# Patient Record
Sex: Male | Born: 1940
Health system: Southern US, Community
[De-identification: ages and names within clinical notes are randomized; demographics above are authoritative.]

## PROBLEM LIST (undated history)

## (undated) DIAGNOSIS — F32A Depression, unspecified: Secondary | ICD-10-CM

## (undated) DIAGNOSIS — F329 Major depressive disorder, single episode, unspecified: Secondary | ICD-10-CM

## (undated) DIAGNOSIS — E785 Hyperlipidemia, unspecified: Secondary | ICD-10-CM

## (undated) DIAGNOSIS — F419 Anxiety disorder, unspecified: Secondary | ICD-10-CM

## (undated) DIAGNOSIS — I10 Essential (primary) hypertension: Secondary | ICD-10-CM

## (undated) HISTORY — DX: Anxiety disorder, unspecified: F41.9

## (undated) HISTORY — PX: TONSILLECTOMY: SUR1361

## (undated) HISTORY — PX: OTHER SURGICAL HISTORY: SHX169

## (undated) HISTORY — DX: Major depressive disorder, single episode, unspecified: F32.9

## (undated) HISTORY — DX: Hyperlipidemia, unspecified: E78.5

## (undated) HISTORY — DX: Essential (primary) hypertension: I10

## (undated) HISTORY — DX: Depression, unspecified: F32.A

---

## 1999-12-22 ENCOUNTER — Encounter: Admission: RE | Admit: 1999-12-22 | Discharge: 2000-01-10 | Payer: Self-pay | Admitting: Internal Medicine

## 1999-12-22 ENCOUNTER — Encounter: Payer: Self-pay | Admitting: Internal Medicine

## 1999-12-22 ENCOUNTER — Encounter: Admission: RE | Admit: 1999-12-22 | Discharge: 1999-12-22 | Payer: Self-pay | Admitting: Internal Medicine

## 2002-07-09 ENCOUNTER — Encounter: Admission: RE | Admit: 2002-07-09 | Discharge: 2002-07-09 | Payer: Self-pay | Admitting: Internal Medicine

## 2002-07-09 ENCOUNTER — Encounter: Payer: Self-pay | Admitting: Internal Medicine

## 2002-07-17 ENCOUNTER — Encounter: Admission: RE | Admit: 2002-07-17 | Discharge: 2002-07-17 | Payer: Self-pay | Admitting: Internal Medicine

## 2002-07-17 ENCOUNTER — Encounter: Payer: Self-pay | Admitting: Internal Medicine

## 2002-07-18 ENCOUNTER — Ambulatory Visit (HOSPITAL_COMMUNITY): Admission: RE | Admit: 2002-07-18 | Discharge: 2002-07-18 | Payer: Self-pay | Admitting: Internal Medicine

## 2002-07-18 ENCOUNTER — Encounter: Payer: Self-pay | Admitting: Internal Medicine

## 2008-01-27 ENCOUNTER — Ambulatory Visit: Payer: Self-pay | Admitting: Internal Medicine

## 2008-06-07 ENCOUNTER — Ambulatory Visit: Payer: Self-pay | Admitting: Internal Medicine

## 2008-12-06 ENCOUNTER — Ambulatory Visit: Payer: Self-pay | Admitting: Internal Medicine

## 2009-01-28 ENCOUNTER — Ambulatory Visit: Payer: Self-pay | Admitting: Internal Medicine

## 2009-06-24 ENCOUNTER — Ambulatory Visit: Payer: Self-pay | Admitting: Internal Medicine

## 2009-11-28 ENCOUNTER — Ambulatory Visit: Payer: Self-pay | Admitting: Internal Medicine

## 2010-06-26 ENCOUNTER — Other Ambulatory Visit: Payer: Medicare Other | Admitting: Internal Medicine

## 2010-06-26 ENCOUNTER — Encounter (INDEPENDENT_AMBULATORY_CARE_PROVIDER_SITE_OTHER): Payer: Medicare Other | Admitting: Internal Medicine

## 2010-06-26 DIAGNOSIS — E785 Hyperlipidemia, unspecified: Secondary | ICD-10-CM

## 2010-06-26 DIAGNOSIS — M109 Gout, unspecified: Secondary | ICD-10-CM

## 2010-06-26 DIAGNOSIS — I10 Essential (primary) hypertension: Secondary | ICD-10-CM

## 2010-10-06 ENCOUNTER — Other Ambulatory Visit: Payer: Self-pay | Admitting: Internal Medicine

## 2010-12-21 ENCOUNTER — Encounter: Payer: Self-pay | Admitting: Internal Medicine

## 2010-12-26 ENCOUNTER — Other Ambulatory Visit: Payer: Medicare Other | Admitting: Internal Medicine

## 2010-12-26 DIAGNOSIS — E785 Hyperlipidemia, unspecified: Secondary | ICD-10-CM

## 2010-12-26 LAB — HEPATIC FUNCTION PANEL
AST: 18 U/L (ref 0–37)
Albumin: 4.5 g/dL (ref 3.5–5.2)
Total Bilirubin: 0.6 mg/dL (ref 0.3–1.2)

## 2010-12-26 LAB — LIPID PANEL
Cholesterol: 147 mg/dL (ref 0–200)
HDL: 54 mg/dL (ref >39)
LDL Cholesterol: 71 mg/dL (ref 0–99)
Total CHOL/HDL Ratio: 2.7 Ratio
Triglycerides: 108 mg/dL (ref <150)
VLDL: 22 mg/dL (ref 0–40)

## 2011-01-01 ENCOUNTER — Ambulatory Visit (INDEPENDENT_AMBULATORY_CARE_PROVIDER_SITE_OTHER): Payer: Medicare Other | Admitting: Internal Medicine

## 2011-01-01 ENCOUNTER — Encounter: Payer: Self-pay | Admitting: Internal Medicine

## 2011-01-01 VITALS — BP 124/79 | HR 80 | Temp 97.8°F | Ht 69.0 in | Wt 218.0 lb

## 2011-01-01 DIAGNOSIS — F32A Depression, unspecified: Secondary | ICD-10-CM

## 2011-01-01 DIAGNOSIS — E785 Hyperlipidemia, unspecified: Secondary | ICD-10-CM

## 2011-01-01 DIAGNOSIS — N529 Male erectile dysfunction, unspecified: Secondary | ICD-10-CM

## 2011-01-01 DIAGNOSIS — M109 Gout, unspecified: Secondary | ICD-10-CM

## 2011-01-01 DIAGNOSIS — I10 Essential (primary) hypertension: Secondary | ICD-10-CM

## 2011-01-01 DIAGNOSIS — F3289 Other specified depressive episodes: Secondary | ICD-10-CM

## 2011-01-01 DIAGNOSIS — Z23 Encounter for immunization: Secondary | ICD-10-CM

## 2011-01-01 DIAGNOSIS — F329 Major depressive disorder, single episode, unspecified: Secondary | ICD-10-CM

## 2011-01-04 DIAGNOSIS — F329 Major depressive disorder, single episode, unspecified: Secondary | ICD-10-CM | POA: Insufficient documentation

## 2011-01-04 DIAGNOSIS — I10 Essential (primary) hypertension: Secondary | ICD-10-CM | POA: Insufficient documentation

## 2011-01-04 DIAGNOSIS — E785 Hyperlipidemia, unspecified: Secondary | ICD-10-CM | POA: Insufficient documentation

## 2011-01-04 DIAGNOSIS — N529 Male erectile dysfunction, unspecified: Secondary | ICD-10-CM | POA: Insufficient documentation

## 2011-01-04 DIAGNOSIS — M109 Gout, unspecified: Secondary | ICD-10-CM | POA: Insufficient documentation

## 2011-01-04 NOTE — Progress Notes (Signed)
  Subjective:    Patient ID: Dennard Nip, male    DOB: 11/01/1940, 70 y.o.   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 History of hypertension, hyperlipidemia, gout, depression, erectile dysfunction. For six-month recheck. No new complaints or problems.    Review of Systems     Objective:   Physical Exam no JVD thyromegaly or carotid bruits; chest clear, cardiac exam regular rate and rhythm normal S1 and S2, extremities without edema        Assessment & Plan:  Hypertension-stable  Hyperlipidemia-stable on statin  Family history of coronary artery disease in his father and stroke in his mother  Gout stable on allopurinol  History of depression treated with Prozac successfully  Erectile dysfunction-uses Viagra when necessary  Return in 6 months for physical exam

## 2011-02-22 ENCOUNTER — Other Ambulatory Visit: Payer: Self-pay | Admitting: Internal Medicine

## 2011-07-05 ENCOUNTER — Other Ambulatory Visit: Payer: Medicare Other | Admitting: Internal Medicine

## 2011-07-05 DIAGNOSIS — I10 Essential (primary) hypertension: Secondary | ICD-10-CM

## 2011-07-05 DIAGNOSIS — E785 Hyperlipidemia, unspecified: Secondary | ICD-10-CM

## 2011-07-05 DIAGNOSIS — Z125 Encounter for screening for malignant neoplasm of prostate: Secondary | ICD-10-CM

## 2011-07-05 LAB — CBC WITH DIFFERENTIAL/PLATELET
Basophils Absolute: 0 10*3/uL (ref 0.0–0.1)
HCT: 43 % (ref 39.0–52.0)
Hemoglobin: 14.8 g/dL (ref 13.0–17.0)
Lymphocytes Relative: 28 % (ref 12–46)
Monocytes Absolute: 0.6 10*3/uL (ref 0.1–1.0)
Neutro Abs: 2.6 10*3/uL (ref 1.7–7.7)
RBC: 4.6 MIL/uL (ref 4.22–5.81)
RDW: 13.7 % (ref 11.5–15.5)
WBC: 4.5 10*3/uL (ref 4.0–10.5)

## 2011-07-05 LAB — COMPREHENSIVE METABOLIC PANEL
ALT: 20 U/L (ref 0–53)
AST: 21 U/L (ref 0–37)
Albumin: 4.6 g/dL (ref 3.5–5.2)
BUN: 13 mg/dL (ref 6–23)
Calcium: 9.5 mg/dL (ref 8.4–10.5)
Chloride: 101 mEq/L (ref 96–112)
Potassium: 4.8 mEq/L (ref 3.5–5.3)

## 2011-07-06 ENCOUNTER — Encounter: Payer: Self-pay | Admitting: Internal Medicine

## 2011-07-06 ENCOUNTER — Ambulatory Visit (INDEPENDENT_AMBULATORY_CARE_PROVIDER_SITE_OTHER): Payer: Medicare Other | Admitting: Internal Medicine

## 2011-07-06 VITALS — BP 140/78 | HR 80 | Temp 97.7°F | Ht 68.5 in | Wt 224.0 lb

## 2011-07-06 DIAGNOSIS — Z0000 Encounter for general adult medical examination without abnormal findings: Secondary | ICD-10-CM

## 2011-07-06 DIAGNOSIS — E785 Hyperlipidemia, unspecified: Secondary | ICD-10-CM

## 2011-07-06 DIAGNOSIS — I10 Essential (primary) hypertension: Secondary | ICD-10-CM

## 2011-07-06 LAB — POCT URINALYSIS DIPSTICK
Bilirubin, UA: NEGATIVE
Glucose, UA: NEGATIVE
Leukocytes, UA: NEGATIVE
Nitrite, UA: NEGATIVE

## 2011-07-06 LAB — PSA, MEDICARE: PSA: 0.3 ng/mL (ref <=4.00)

## 2011-07-10 ENCOUNTER — Other Ambulatory Visit: Payer: Self-pay | Admitting: Internal Medicine

## 2011-07-21 NOTE — Patient Instructions (Signed)
Colonoscopy is due December 2013. Please contact gastroenterology office for appointment. Continue same medications and return in 6 months.

## 2011-07-21 NOTE — Progress Notes (Signed)
  Subjective:    Patient ID: Jimmy [name], male    DOB: 1940-11-07, 71 y.o.   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 History of hypertension, hyperlipidemia, depression, rectal dysfunction and gout. He is due for repeat colonoscopy December 2013. Last colonoscopy December 2003 by Dr. Juanda Chance. Had exercise tolerance test 1998 by Dr. Charlies Constable. Zostavax vaccine August 2010. Pneumovax vaccine March 2011. Tetanus immunization 2007. Gets annual influenza immunization. History of herpes zoster right temple December 1998. History of cellulitis right foot 1996. No known drug allergies.  Family history father with history of coronary artery disease status post CABG died with heart problems. Father had history of hypertension and gout. He also had a history of alcohol abuse.  Mother living with history of stroke and hypertension. 3 sisters and 2 brothers in good health. 3 adult daughters in good health.  Social history patient is divorced. Does not smoke. Social alcohol consumption consisting of beer daily.    Review of Systems  Constitutional: Negative.   HENT: Negative.   Eyes: Negative.   Respiratory: Negative.   Cardiovascular: Negative.   Gastrointestinal: Negative.   Genitourinary:       Erectile dysfunction. Some BPH symptoms  Musculoskeletal:       History of gout  Neurological: Negative.   Psychiatric/Behavioral:       History of depression       Objective:   Physical Exam  Vitals reviewed. Constitutional: He is oriented to person, place, and time. He appears well-developed and well-nourished. No distress.  HENT:  Head: Normocephalic and atraumatic.  Right Ear: External ear normal.  Left Ear: External ear normal.  Mouth/Throat: Oropharynx is clear and moist.  Eyes: Conjunctivae and EOM are normal. Pupils are equal, round, and reactive to light. Right eye  exhibits no discharge. Left eye exhibits no discharge. No scleral icterus.  Neck: Neck supple. No JVD present. No thyromegaly present.       No carotid bruits  Cardiovascular: Normal rate, regular rhythm, normal heart sounds and intact distal pulses.   No murmur heard. Pulmonary/Chest: Effort normal and breath sounds normal. No respiratory distress. He has no wheezes. He has no rales.  Abdominal: Soft. Bowel sounds are normal. He exhibits no distension and no mass. There is no tenderness. There is no guarding.  Genitourinary: Prostate normal.       Prostate slightly enlarged without nodules  Musculoskeletal: Normal range of motion. He exhibits no edema.  Lymphadenopathy:    He has no cervical adenopathy.  Neurological: He is alert and oriented to person, place, and time. He has normal reflexes. No cranial nerve deficit. Coordination normal.  Skin: Skin is warm and dry. No rash noted. He is not diaphoretic.  Psychiatric: He has a normal mood and affect. His behavior is normal. Judgment normal.          Assessment & Plan:  Hypertension  Hyperlipidemia  Erectile dysfunction  History of gout  History of depression  Plan: Return in 6 months for office visit lipid panel liver functions and blood pressure check. Continue same medications. Will need repeat screening colonoscopy December 2013.

## 2011-07-25 ENCOUNTER — Telehealth: Payer: Self-pay | Admitting: Internal Medicine

## 2011-07-25 MED ORDER — FLUOXETINE HCL 10 MG PO CAPS: 10.0000 mg | ORAL_CAPSULE | Freq: Every day | ORAL | Status: DC

## 2011-07-25 NOTE — Telephone Encounter (Signed)
New rx for Prozac 10 mg po daily sent too patients pharmacy

## 2012-01-07 ENCOUNTER — Other Ambulatory Visit: Payer: Medicare Other | Admitting: Internal Medicine

## 2012-01-07 DIAGNOSIS — Z79899 Other long term (current) drug therapy: Secondary | ICD-10-CM

## 2012-01-07 DIAGNOSIS — E785 Hyperlipidemia, unspecified: Secondary | ICD-10-CM

## 2012-01-07 LAB — HEPATIC FUNCTION PANEL
Alkaline Phosphatase: 64 U/L (ref 39–117)
Bilirubin, Direct: 0.1 mg/dL (ref 0.0–0.3)
Indirect Bilirubin: 0.6 mg/dL (ref 0.0–0.9)
Total Bilirubin: 0.7 mg/dL (ref 0.3–1.2)
Total Protein: 6.9 g/dL (ref 6.0–8.3)

## 2012-01-07 LAB — LIPID PANEL
LDL Cholesterol: 94 mg/dL (ref 0–99)
Triglycerides: 212 mg/dL — ABNORMAL HIGH (ref <150)

## 2012-01-08 ENCOUNTER — Ambulatory Visit (INDEPENDENT_AMBULATORY_CARE_PROVIDER_SITE_OTHER): Payer: Medicare Other | Admitting: Internal Medicine

## 2012-01-08 VITALS — BP 140/86 | HR 80 | Temp 98.4°F | Wt 228.0 lb

## 2012-01-08 DIAGNOSIS — E785 Hyperlipidemia, unspecified: Secondary | ICD-10-CM

## 2012-01-08 DIAGNOSIS — N529 Male erectile dysfunction, unspecified: Secondary | ICD-10-CM

## 2012-01-08 DIAGNOSIS — Z8739 Personal history of other diseases of the musculoskeletal system and connective tissue: Secondary | ICD-10-CM

## 2012-01-08 DIAGNOSIS — F329 Major depressive disorder, single episode, unspecified: Secondary | ICD-10-CM

## 2012-01-08 DIAGNOSIS — F3289 Other specified depressive episodes: Secondary | ICD-10-CM

## 2012-01-08 DIAGNOSIS — Z862 Personal history of diseases of the blood and blood-forming organs and certain disorders involving the immune mechanism: Secondary | ICD-10-CM

## 2012-01-08 DIAGNOSIS — I10 Essential (primary) hypertension: Secondary | ICD-10-CM

## 2012-01-08 DIAGNOSIS — Z8639 Personal history of other endocrine, nutritional and metabolic disease: Secondary | ICD-10-CM

## 2012-01-21 ENCOUNTER — Other Ambulatory Visit: Payer: Self-pay

## 2012-01-21 MED ORDER — INDOMETHACIN 25 MG PO CAPS: 25.0000 mg | ORAL_CAPSULE | Freq: Three times a day (TID) | ORAL | Status: DC

## 2012-03-20 ENCOUNTER — Other Ambulatory Visit: Payer: Self-pay | Admitting: Internal Medicine

## 2012-03-30 ENCOUNTER — Encounter: Payer: Self-pay | Admitting: Internal Medicine

## 2012-03-30 NOTE — Patient Instructions (Addendum)
Continue same medications and return in 6 months 

## 2012-03-30 NOTE — Progress Notes (Signed)
  Subjective:    Patient ID: Jimmy [name], male    DOB: 10-11-1940, 71 y.o.   MRN: [id number]  HPI 71 year old white male in today for six-month recheck. History of hypertension, hyperlipidemia, depression, erectile dysfunction and gout. Depression well controlled on Prozac. Takes Viagra for erectile dysfunction. Is on allopurinol to control gout. Is on Zocor for hyperlipidemia and generic Mavik for hypertension. His mother died suddenly a few months ago and [place] She had a history of hypertension and CVA. He is dealing with the estate and seemingly handling it all very well. Father with history of coronary artery disease. Patient does not smoke. Social alcohol consumption.      Review of Systems     Objective:   Physical Exam neck supple without thyromegaly JVD or carotid bruits. Facial plethora, chest clear to auscultation, Cardiac exam: Regular rate and rhythm normal S1 and S2 . Extremities without edema.          Assessment & Plan:  Hypertension-reasonably well controlled  History of gout-treated with allopurinol  History of depression as well controlled on Prozac  History of erectile dysfunction treated with Viagra  Plan: Return in 6 months for physical exam.

## 2012-04-18 ENCOUNTER — Encounter: Payer: Self-pay | Admitting: Internal Medicine

## 2012-04-24 ENCOUNTER — Encounter: Payer: Self-pay | Admitting: Internal Medicine

## 2012-05-13 ENCOUNTER — Ambulatory Visit (AMBULATORY_SURGERY_CENTER): Payer: Medicare Other | Admitting: *Deleted

## 2012-05-13 VITALS — Ht 70.0 in | Wt 228.6 lb

## 2012-05-13 DIAGNOSIS — Z1211 Encounter for screening for malignant neoplasm of colon: Secondary | ICD-10-CM

## 2012-05-13 MED ORDER — MOVIPREP 100 G PO SOLR: ORAL | Status: DC

## 2012-05-27 ENCOUNTER — Encounter: Payer: Self-pay | Admitting: Internal Medicine

## 2012-05-27 ENCOUNTER — Ambulatory Visit (AMBULATORY_SURGERY_CENTER): Payer: Medicare Other | Admitting: Internal Medicine

## 2012-05-27 VITALS — BP 134/59 | HR 61 | Resp 15 | Ht 70.0 in | Wt 228.0 lb

## 2012-05-27 DIAGNOSIS — Z1211 Encounter for screening for malignant neoplasm of colon: Secondary | ICD-10-CM

## 2012-05-27 MED ORDER — SODIUM CHLORIDE 0.9 % IV SOLN: 500.0000 mL | INTRAVENOUS | Status: DC

## 2012-05-27 NOTE — Progress Notes (Signed)
Patient did not have preoperative order for IV antibiotic SSI prophylaxis. (G8918)   

## 2012-05-27 NOTE — Progress Notes (Signed)
Patient did not experience any of the following events: a burn prior to discharge; a fall within the facility; wrong site/side/patient/procedure/implant event; or a hospital transfer or hospital admission upon discharge from the facility. (G8907) Patient did not have preoperative order for IV antibiotic SSI prophylaxis. (G8918)  

## 2012-05-27 NOTE — Op Note (Signed)
[facility] [address] [place], [address]   COLONOSCOPY PROCEDURE REPORT  PATIENT: [name], Jimmy  MR#: [id number] BIRTHDATE: Dec 15, 1940 , 71  yrs. old GENDER: Male ENDOSCOPIST: Hart Carwin, MD REFERRED BY:  Sharlet Salina, M.D. PROCEDURE DATE:  05/27/2012 PROCEDURE:   Colonoscopy, screening ASA CLASS:   Class II INDICATIONS:Average risk patient for colon cancer.  colonoscopy 10 years ago was normal MEDICATIONS: MAC sedation, administered by CRNA and propofol (Diprivan) 400mg  IV  DESCRIPTION OF PROCEDURE:   After the risks and benefits and of the procedure were explained, informed consent was obtained.  A digital rectal exam revealed no abnormalities of the rectum.    The LB [id number]  endoscope was introduced through the anus and advanced to the cecum, which was identified by both the appendix and ileocecal valve .  The quality of the prep was excellent, using MoviPrep .  The instrument was then slowly withdrawn as the colon was fully examined.     COLON FINDINGS: A normal appearing cecum, ileocecal valve, and appendiceal orifice were identified.  The ascending, hepatic flexure, transverse, splenic flexure, descending, sigmoid colon and rectum appeared unremarkable.  No polyps or cancers were seen. Retroflexed views revealed no abnormalities.     The scope was then withdrawn from the patient and the procedure completed.  COMPLICATIONS: There were no complications. ENDOSCOPIC IMPRESSION: Normal colon  RECOMMENDATIONS: High fiber diet   REPEAT EXAM: In 10 year(s)  for Colonoscopy.  cc:  _______________________________ eSignedHart Carwin, MD 05/27/2012 11:44 AM

## 2012-05-27 NOTE — Patient Instructions (Addendum)
YOU HAD AN ENDOSCOPIC PROCEDURE TODAY AT THE [facility]: Refer to the procedure report that was given to you for any specific questions about what was found during the examination.  If the procedure report does not answer your questions, please call your gastroenterologist to clarify.  If you requested that your care partner not be given the details of your procedure findings, then the procedure report has been included in a sealed envelope for you to review at your convenience later.  YOU SHOULD EXPECT: Some feelings of bloating in the abdomen. Passage of more gas than usual.  Walking can help get rid of the air that was put into your GI tract during the procedure and reduce the bloating. If you had a lower endoscopy (such as a colonoscopy or flexible sigmoidoscopy) you may notice spotting of blood in your stool or on the toilet paper. If you underwent a bowel prep for your procedure, then you may not have a normal bowel movement for a few days.  DIET: Your first meal following the procedure should be a light meal and then it is ok to progress to your normal diet.  A half-sandwich or bowl of soup is an example of a good first meal.  Heavy or fried foods are harder to digest and may make you feel nauseous or bloated.  Likewise meals heavy in dairy and vegetables can cause extra gas to form and this can also increase the bloating.  Drink plenty of fluids but you should avoid alcoholic beverages for 24 hours.  ACTIVITY: Your care partner should take you home directly after the procedure.  You should plan to take it easy, moving slowly for the rest of the day.  You can resume normal activity the day after the procedure however you should NOT DRIVE or use heavy machinery for 24 hours (because of the sedation medicines used during the test).    SYMPTOMS TO REPORT IMMEDIATELY: A gastroenterologist can be reached at any hour.  During normal business hours, 8:30 AM to 5:00 PM Monday through Friday,  call [phone].  After hours and on weekends, please call the GI answering service at [phone] who will take a message and have the physician on call contact you.   Following lower endoscopy (colonoscopy or flexible sigmoidoscopy):  Excessive amounts of blood in the stool  Significant tenderness or worsening of abdominal pains  Swelling of the abdomen that is new, acute  Fever of 100F or higher  FOLLOW UP: If any biopsies were taken you will be contacted by phone or by letter within the next 1-3 weeks.  Call your gastroenterologist if you have not heard about the biopsies in 3 weeks.  Our staff will call the home number listed on your records the next business day following your procedure to check on you and address any questions or concerns that you may have at that time regarding the information given to you following your procedure. This is a courtesy call and so if there is no answer at the home number and we have not heard from you through the emergency physician on call, we will assume that you have returned to your regular daily activities without incident.  SIGNATURES/CONFIDENTIALITY: You and/or your care partner have signed paperwork which will be entered into your electronic medical record.  These signatures attest to the fact that that the information above on your After Visit Summary has been reviewed and is understood.  Full responsibility of the confidentiality of this   discharge information lies with you and/or your care-partner.   Thank-you for choosing us for your medical needs. 

## 2012-05-28 ENCOUNTER — Telehealth: Payer: Self-pay | Admitting: *Deleted

## 2012-05-28 NOTE — Telephone Encounter (Signed)
  Follow up Call-  Call back number 05/27/2012  Post procedure Call Back phone  # [phone]  Permission to leave phone message Yes     Patient questions:  Do you have a fever, pain , or abdominal swelling? no Pain Score  0 *  Have you tolerated food without any problems? yes  Have you been able to return to your normal activities? yes  Do you have any questions about your discharge instructions: Diet   no Medications  no Follow up visit  no  Do you have questions or concerns about your Care? no  Actions: * If pain score is 4 or above: No action needed, pain <4.

## 2012-07-04 ENCOUNTER — Other Ambulatory Visit: Payer: Self-pay | Admitting: Internal Medicine

## 2012-07-07 ENCOUNTER — Other Ambulatory Visit: Payer: Medicare Other | Admitting: Internal Medicine

## 2012-07-07 LAB — COMPREHENSIVE METABOLIC PANEL
ALT: 24 U/L (ref 0–53)
AST: 23 U/L (ref 0–37)
Alkaline Phosphatase: 65 U/L (ref 39–117)
Calcium: 9.4 mg/dL (ref 8.4–10.5)
Chloride: 102 mEq/L (ref 96–112)
Creat: 1.04 mg/dL (ref 0.50–1.35)
Total Bilirubin: 0.7 mg/dL (ref 0.3–1.2)

## 2012-07-07 LAB — LIPID PANEL
HDL: 60 mg/dL (ref >39)
LDL Cholesterol: 77 mg/dL (ref 0–99)
Total CHOL/HDL Ratio: 2.7 Ratio
VLDL: 22 mg/dL (ref 0–40)

## 2012-07-07 LAB — CBC WITH DIFFERENTIAL/PLATELET
Basophils Absolute: 0 10*3/uL (ref 0.0–0.1)
Basophils Relative: 1 % (ref 0–1)
Eosinophils Relative: 2 % (ref 0–5)
Lymphocytes Relative: 29 % (ref 12–46)
MCHC: 34.6 g/dL (ref 30.0–36.0)
MCV: 91.2 fL (ref 78.0–100.0)
Platelets: 174 10*3/uL (ref 150–400)
RDW: 14.2 % (ref 11.5–15.5)
WBC: 4.4 10*3/uL (ref 4.0–10.5)

## 2012-07-07 LAB — PSA, MEDICARE: PSA: 0.36 ng/mL (ref <=4.00)

## 2012-07-08 ENCOUNTER — Ambulatory Visit (INDEPENDENT_AMBULATORY_CARE_PROVIDER_SITE_OTHER): Payer: Medicare Other | Admitting: Internal Medicine

## 2012-07-08 ENCOUNTER — Encounter: Payer: Self-pay | Admitting: Internal Medicine

## 2012-07-08 VITALS — BP 126/78 | HR 76 | Ht 68.5 in | Wt 229.0 lb

## 2012-07-08 NOTE — Progress Notes (Signed)
Subjective:    Patient ID: Jimmy [name], male    DOB: 08-14-1940, 72 y.o.   MRN: [id number]  HPI  72 year old White male for health maintenance and evaluation of medical problems. He is Psychologist, counselling. Has a history of hypertension, hyperlipidemia, depression, erectile dysfunction and gout. Had colonoscopy 2003 by Dr. Juanda Chance. Repeat study due. Had exercise tolerance test in 1998 by Dr. Charlies Constable. Zostavax vaccine August 2010. Pneumovax vaccine March 2011. Tetanus immunization 2007. Gets annual influenza immunization. History of herpes zoster right temple December 1998. History of cellulitis right foot 1996.  No known drug allergies  Family history: Father with history of coronary artery disease status post CABG died with heart problems. Father had history of hypertension and gout and history of alcohol abuse. Mother had history of stroke and hypertension and died suddenly of an acute arrest perhaps stroke or MI. 32sisters and 2 brothers in good health. One sister died in a motor vehicle accident with history of alcoholism. 3 adult daughters in good health.  Social history: Patient is divorced. Does not smoke. Social alcohol consumption consisting of beer daily. Steady girlfriend is Jimmy [name].    Review of Systems  Constitutional: Negative.   All other systems reviewed and are negative.       Objective:   Physical Exam  Vitals reviewed. Constitutional: He is oriented to person, place, and time. He appears well-developed and well-nourished. No distress.  Facial plethora  HENT:  Head: Normocephalic and atraumatic.  Right Ear: External ear normal.  Left Ear: External ear normal.  Mouth/Throat: Oropharynx is clear and moist. No oropharyngeal exudate.  Eyes: Conjunctivae and EOM are normal. Pupils are equal, round, and reactive to light. Right eye exhibits no discharge. Left eye exhibits no discharge. No scleral icterus.  Neck: Neck supple. No JVD  present. No thyromegaly present.  Cardiovascular: Normal rate, regular rhythm, normal heart sounds and intact distal pulses.   No murmur heard. Pulmonary/Chest: Effort normal and breath sounds normal. No respiratory distress. He has no wheezes. He has no rales. He exhibits no tenderness.  Abdominal: Soft. Bowel sounds are normal. He exhibits no distension and no mass. There is no tenderness. There is no rebound and no guarding.  Genitourinary: Prostate normal.  Musculoskeletal: He exhibits no edema.  Lymphadenopathy:    He has no cervical adenopathy.  Neurological: He is oriented to person, place, and time. He has normal reflexes. No cranial nerve deficit. Coordination normal.  Skin: Skin is warm and dry. No rash noted. He is not diaphoretic.  Psychiatric: He has a normal mood and affect. His behavior is normal. Judgment and thought content normal.          Assessment & Plan:  Hypertension-stable on Mavik but insurance plan will no longer cover generic Mavik. He will consider paying out of pocket because he really doesn't want to switch. If he needs to switch we can consider losartan.  Hyperlipidemia-stable on statin  Depression stable on Prozac  Erectile dysfunction treated with Viagra  Gout treated with allopurinol     Subjective:   Patient presents for Medicare Annual/Subsequent preventive examination.   Review Past Medical/Family/Social: SEE EPIC   Risk Factors  Current exercise habits: walking Dietary issues discussed: low fat diet  Cardiac risk factors: Family History of heart disease, HTN, Hyperlipidemia  Depression Screen  (Note: if answer to either of the following is "Yes", a more complete depression screening is indicated)   Over the past two weeks,  have you felt down, depressed or hopeless? No  Over the past two weeks, have you felt little interest or pleasure in doing things? No Have you lost interest or pleasure in daily life? No Do you often feel  hopeless? No Do you cry easily over simple problems? No   Activities of Daily Living  In your present state of health, do you have any difficulty performing the following activities?:   Driving? No  Managing money? No  Feeding yourself? No  Getting from bed to chair? No  Climbing a flight of stairs? No  Preparing food and eating?: No  Bathing or showering? No  Getting dressed: No  Getting to the toilet? No  Using the toilet:No  Moving around from place to place: No  In the past year have you fallen or had a near fall?:No  Are you sexually active? yes Do you have more than one partner? No   Hearing Difficulties: No  Do you often ask people to speak up or repeat themselves? No  Do you experience ringing or noises in your ears? No  Do you have difficulty understanding soft or whispered voices? No  Do you feel that you have a problem with memory? No Do you often misplace items? No    Home Safety:  Do you have a smoke alarm at your residence? Yes Do you have grab bars in the bathroom? no Do you have throw rugs in your house?no   Cognitive Testing  Alert? Yes Normal Appearance?Yes  Oriented to person? Yes Place? Yes  Time? Yes  Recall of three objects? Yes  Can perform simple calculations? Yes  Displays appropriate judgment?Yes  Can read the correct time from a watch face?Yes   List the Names of Other Physician/Practitioners you currently use:  See referral list for the physicians patient is currently seeing. Dr. Emily Filbert eye doctor    Review of Systems: See Epic   Objective:     General appearance: Appears stated age   Head: Normocephalic, without obvious abnormality, atraumatic  Eyes: conj clear, EOMi PEERLA  Ears: normal TM's and external ear canals both ears  Nose: Nares normal. Septum midline. Mucosa normal. No drainage or sinus tenderness.  Throat: lips, mucosa, and tongue normal; teeth and gums normal  Neck: no adenopathy, no carotid bruit, no JVD, supple,  symmetrical, trachea midline and thyroid not enlarged, symmetric, no tenderness/mass/nodules  No CVA tenderness.  Lungs: clear to auscultation bilaterally  Breasts: normal appearance, no masses or tenderness. Heart: regular rate and rhythm, S1, S2 normal, no murmur, click, rub or gallop  Abdomen: soft, non-tender; bowel sounds normal; no masses, no organomegaly  Musculoskeletal: ROM normal in all joints, no crepitus, no deformity, Normal muscle strengthen. Back  is symmetric, no curvature. Skin: Skin color, texture, turgor normal. No rashes or lesions  Lymph nodes: Cervical, supraclavicular, and axillary nodes normal.  Neurologic: CN 2 -12 Normal, Normal symmetric reflexes. Normal coordination and gait  Psych: Alert & Oriented x 3, Mood appear stable.    Assessment:    Annual wellness medicare exam   Plan:    During the course of the visit the patient was educated and counseled about appropriate screening and preventive services including:   See above needs repeat colonoscopy     Patient Instructions (the written plan) was given to the patient.  Medicare Attestation  I have personally reviewed:  The patient's medical and social history  Their use of alcohol, tobacco or illicit drugs  Their current medications and  supplements  The patient's functional ability including ADLs,fall risks, home safety risks, cognitive, and hearing and visual impairment  Diet and physical activities  Evidence for depression or mood disorders  The patient's weight, height, BMI, and visual acuity have been recorded in the chart. I have made referrals, counseling, and provided education to the patient based on review of the above and I have provided the patient with a written personalized [name] plan for preventive services.

## 2012-07-14 ENCOUNTER — Encounter: Payer: Self-pay | Admitting: Internal Medicine

## 2012-07-14 NOTE — Patient Instructions (Addendum)
Patient to check with pharmacy to see what cost of generic Mavik will be out of pocket. If too expensive we will consider changing to losartan which apparently is covered. Otherwise continue same medications and return in 6 months. Needs to have repeat screening colonoscopy. He will contact Dr. Regino Schultze office.

## 2012-09-10 ENCOUNTER — Other Ambulatory Visit: Payer: Self-pay

## 2012-09-10 MED ORDER — FLUOXETINE HCL 10 MG PO CAPS: 10.0000 mg | ORAL_CAPSULE | Freq: Every day | ORAL | Status: DC

## 2012-10-20 ENCOUNTER — Other Ambulatory Visit: Payer: Self-pay | Admitting: Internal Medicine

## 2013-01-12 ENCOUNTER — Other Ambulatory Visit: Payer: Medicare Other | Admitting: Internal Medicine

## 2013-01-12 DIAGNOSIS — E785 Hyperlipidemia, unspecified: Secondary | ICD-10-CM

## 2013-01-12 DIAGNOSIS — Z79899 Other long term (current) drug therapy: Secondary | ICD-10-CM

## 2013-01-12 LAB — LIPID PANEL: LDL Cholesterol: 72 mg/dL (ref 0–99)

## 2013-01-12 LAB — HEPATIC FUNCTION PANEL
Alkaline Phosphatase: 63 U/L (ref 39–117)
Indirect Bilirubin: 0.5 mg/dL (ref 0.0–0.9)
Total Bilirubin: 0.6 mg/dL (ref 0.3–1.2)

## 2013-01-13 ENCOUNTER — Encounter: Payer: Self-pay | Admitting: Internal Medicine

## 2013-01-13 ENCOUNTER — Ambulatory Visit (INDEPENDENT_AMBULATORY_CARE_PROVIDER_SITE_OTHER): Payer: Medicare Other | Admitting: Internal Medicine

## 2013-01-13 VITALS — BP 120/80 | HR 76 | Temp 97.8°F | Wt 229.0 lb

## 2013-01-13 DIAGNOSIS — Z862 Personal history of diseases of the blood and blood-forming organs and certain disorders involving the immune mechanism: Secondary | ICD-10-CM

## 2013-01-13 DIAGNOSIS — I10 Essential (primary) hypertension: Secondary | ICD-10-CM

## 2013-01-13 DIAGNOSIS — E785 Hyperlipidemia, unspecified: Secondary | ICD-10-CM

## 2013-01-13 DIAGNOSIS — Z8739 Personal history of other diseases of the musculoskeletal system and connective tissue: Secondary | ICD-10-CM

## 2013-01-13 DIAGNOSIS — Z23 Encounter for immunization: Secondary | ICD-10-CM

## 2013-01-13 DIAGNOSIS — N529 Male erectile dysfunction, unspecified: Secondary | ICD-10-CM

## 2013-01-13 NOTE — Patient Instructions (Addendum)
Continue same medications and return in 6 months.  Flu vaccine given today. 

## 2013-01-13 NOTE — Progress Notes (Signed)
  Subjective:    Patient ID: Jimmy [name], male    DOB: 06-09-1940, 72 y.o.   MRN: [id number]  HPI  72 year old white male in today for followup of hypertension, hyperlipidemia, erectile dysfunction, history of gout. Since being on allopurinol has had no further recurrence of gout. Lipid panel liver functions are within normal limits on statin medication. Blood pressures under good control with current regimen. Influenza vaccine given today. No complaints. No history of chest pain.   Review of Systems     Objective:   Physical Exam Neck is supple without JVD, or carotid bruits. Chest clear to auscultation. Cardiac exam regular rate and rhythm. Skin is warm and dry. Extremities without edema.        Assessment & Plan:  Hypertension-well controlled on current regimen  Hyperlipidemia-well-controlled on statin medication  Erectile dysfunction-given coupons for Cialis and Viagra  History of gout-treated with allopurinol  Plan: Return in 6 months for physical examination.

## 2013-03-27 ENCOUNTER — Other Ambulatory Visit: Payer: Self-pay | Admitting: Internal Medicine

## 2013-07-04 ENCOUNTER — Other Ambulatory Visit: Payer: Self-pay | Admitting: Internal Medicine

## 2013-07-07 ENCOUNTER — Other Ambulatory Visit: Payer: Medicare Other | Admitting: Internal Medicine

## 2013-07-07 DIAGNOSIS — Z79899 Other long term (current) drug therapy: Secondary | ICD-10-CM

## 2013-07-07 DIAGNOSIS — Z130 Encounter for screening for diseases of the blood and blood-forming organs and certain disorders involving the immune mechanism: Secondary | ICD-10-CM

## 2013-07-07 DIAGNOSIS — Z125 Encounter for screening for malignant neoplasm of prostate: Secondary | ICD-10-CM

## 2013-07-07 DIAGNOSIS — E785 Hyperlipidemia, unspecified: Secondary | ICD-10-CM

## 2013-07-07 LAB — COMPREHENSIVE METABOLIC PANEL
ALT: 20 U/L (ref 0–53)
AST: 19 U/L (ref 0–37)
Albumin: 4.5 g/dL (ref 3.5–5.2)
Alkaline Phosphatase: 66 U/L (ref 39–117)
BUN: 13 mg/dL (ref 6–23)
CALCIUM: 9.2 mg/dL (ref 8.4–10.5)
CHLORIDE: 101 meq/L (ref 96–112)
CO2: 28 meq/L (ref 19–32)
CREATININE: 0.99 mg/dL (ref 0.50–1.35)
Glucose, Bld: 99 mg/dL (ref 70–99)
Potassium: 4.9 mEq/L (ref 3.5–5.3)
SODIUM: 138 meq/L (ref 135–145)
TOTAL PROTEIN: 6.8 g/dL (ref 6.0–8.3)
Total Bilirubin: 0.7 mg/dL (ref 0.2–1.2)

## 2013-07-07 LAB — LIPID PANEL
Cholesterol: 133 mg/dL (ref 0–200)
HDL: 53 mg/dL (ref >39)
LDL Cholesterol: 63 mg/dL (ref 0–99)
Total CHOL/HDL Ratio: 2.5 Ratio
Triglycerides: 84 mg/dL (ref <150)
VLDL: 17 mg/dL (ref 0–40)

## 2013-07-07 LAB — CBC WITH DIFFERENTIAL/PLATELET
BASOS ABS: 0 10*3/uL (ref 0.0–0.1)
Basophils Relative: 0 % (ref 0–1)
EOS PCT: 1 % (ref 0–5)
Eosinophils Absolute: 0.1 10*3/uL (ref 0.0–0.7)
HCT: 43.7 % (ref 39.0–52.0)
Hemoglobin: 15.2 g/dL (ref 13.0–17.0)
LYMPHS ABS: 1.4 10*3/uL (ref 0.7–4.0)
LYMPHS PCT: 28 % (ref 12–46)
MCH: 32.5 pg (ref 26.0–34.0)
MCHC: 34.8 g/dL (ref 30.0–36.0)
MCV: 93.6 fL (ref 78.0–100.0)
Monocytes Absolute: 0.6 10*3/uL (ref 0.1–1.0)
Monocytes Relative: 12 % (ref 3–12)
NEUTROS ABS: 3 10*3/uL (ref 1.7–7.7)
NEUTROS PCT: 59 % (ref 43–77)
PLATELETS: 190 10*3/uL (ref 150–400)
RBC: 4.67 MIL/uL (ref 4.22–5.81)
RDW: 14.7 % (ref 11.5–15.5)
WBC: 5.1 10*3/uL (ref 4.0–10.5)

## 2013-07-08 LAB — PSA, MEDICARE: PSA: 0.57 ng/mL (ref <=4.00)

## 2013-07-09 ENCOUNTER — Encounter: Payer: Self-pay | Admitting: Internal Medicine

## 2013-07-09 ENCOUNTER — Ambulatory Visit (INDEPENDENT_AMBULATORY_CARE_PROVIDER_SITE_OTHER): Payer: Medicare Other | Admitting: Internal Medicine

## 2013-07-09 VITALS — BP 146/86 | HR 80 | Temp 98.1°F | Ht 68.28 in | Wt 232.0 lb

## 2013-07-09 DIAGNOSIS — E785 Hyperlipidemia, unspecified: Secondary | ICD-10-CM

## 2013-07-09 DIAGNOSIS — Z8639 Personal history of other endocrine, nutritional and metabolic disease: Secondary | ICD-10-CM

## 2013-07-09 DIAGNOSIS — Z862 Personal history of diseases of the blood and blood-forming organs and certain disorders involving the immune mechanism: Secondary | ICD-10-CM

## 2013-07-09 DIAGNOSIS — Z8659 Personal history of other mental and behavioral disorders: Secondary | ICD-10-CM

## 2013-07-09 DIAGNOSIS — I10 Essential (primary) hypertension: Secondary | ICD-10-CM

## 2013-07-09 DIAGNOSIS — Z8739 Personal history of other diseases of the musculoskeletal system and connective tissue: Secondary | ICD-10-CM

## 2013-07-09 DIAGNOSIS — Z0000 Encounter for general adult medical examination without abnormal findings: Secondary | ICD-10-CM

## 2013-07-09 LAB — POCT URINALYSIS DIPSTICK
BILIRUBIN UA: NEGATIVE
Glucose, UA: NEGATIVE
KETONES UA: NEGATIVE
Leukocytes, UA: NEGATIVE
NITRITE UA: NEGATIVE
PH UA: 6
Protein, UA: NEGATIVE
RBC UA: NEGATIVE
Spec Grav, UA: >=1.03
Urobilinogen, UA: NEGATIVE

## 2013-08-30 ENCOUNTER — Other Ambulatory Visit: Payer: Self-pay | Admitting: Internal Medicine

## 2013-10-14 ENCOUNTER — Encounter: Payer: Self-pay | Admitting: Internal Medicine

## 2013-10-14 NOTE — Progress Notes (Signed)
Subjective:    Patient ID: Jimmy [name], male    DOB: Jimmy [name], 73 y.o.   MRN: [id number]  HPI 73 year old white male for health maintenance exam and evaluation of medical issues. He has a history of hypertension, hyperlipidemia, depression, erectile dysfunction and gout. Had colonoscopy in 2003 by Dr. Olevia Perches. Repeat study is to you. He was reminded today. Had exercise tolerance test in 1998 by Dr. Eustace Quail which was negative.  Zostavax vaccine Jimmy 2010, Pneumovax vaccine March 2011, tetanus immunization 2007, gets annual influenza immunization.  History of herpes zoster right temple December 1998. History of cellulitis right foot 1996.  No known drug allergies.  Social history: Patient is divorced. He does not smoke. Social alcohol consumption consisting of beer. Has a steady girlfriend. Has adult children, 3 daughters and a stepson. He is Recruitment consultant.  Family history: Father with history of coronary artery disease status post CABG died with heart problems. Father had history of hypertension, gout, and alcohol abuse. Mother had history of stroke and hypertension and died suddenly of an acute arrest perhaps stroke or MI. One sister died in a motor vehicle accident with history of alcoholism. 2 sisters in good health. 2 brothers, one  with history of lymphoma doing well.    Review of Systems  Constitutional: Negative.   All other systems reviewed and are negative.      Objective:   Physical Exam  Vitals reviewed. Constitutional: He is oriented to person, place, and time. He appears well-developed and well-nourished. No distress.  HENT:  Head: Normocephalic and atraumatic.  Right Ear: External ear normal.  Left Ear: External ear normal.  Mouth/Throat: Oropharynx is clear and moist. No oropharyngeal exudate.  Eyes: Conjunctivae and EOM are normal. Pupils are equal, round, and reactive to light. Right eye exhibits no discharge. Left eye exhibits  no discharge. No scleral icterus.  Neck: Neck supple. No JVD present. No thyromegaly present.  Cardiovascular: Normal rate, regular rhythm, normal heart sounds and intact distal pulses.   No murmur heard. Pulmonary/Chest: Effort normal and breath sounds normal. No respiratory distress. He has no wheezes. He has no rales. He exhibits no tenderness.  Abdominal: Soft. Bowel sounds are normal. He exhibits no distension and no mass. There is no tenderness. There is no rebound and no guarding.  Genitourinary: Prostate normal.  Musculoskeletal: Normal range of motion. He exhibits no edema.  Lymphadenopathy:    He has no cervical adenopathy.  Neurological: He is alert and oriented to person, place, and time. He has normal reflexes. No cranial nerve deficit. Coordination normal.  Skin: Skin is warm and dry. No rash noted. He is not diaphoretic.  Facial plethora  Psychiatric: He has a normal mood and affect. His behavior is normal. Judgment and thought content normal.          Assessment & Plan:  Hypertension  History of gout treated with allopurinol with no recent recurrence  Hyperlipidemia-stable on statin  Depression stable on Prozac  History of erectile dysfunction  Plan: Return in 6 months or as needed. Patient is to monitor blood pressure at home and call me if persistently elevated.  Subjective:   Patient presents for Medicare Annual/Subsequent preventive examination.  Review Past Medical/Family/Social: See above   Risk Factors  Current exercise habits: walks Dietary issues discussed: Low-fat low-carb  Cardiac risk factors: HTN, Family Hx, Hyperlipidemia  Depression Screen  (Note: if answer to either of the following is "Yes", a more complete depression screening  is indicated)   Over the past two weeks, have you felt down, depressed or hopeless? No  Over the past two weeks, have you felt little interest or pleasure in doing things? No Have you lost interest or pleasure  in daily life? No Do you often feel hopeless? No Do you cry easily over simple problems? No   Activities of Daily Living  In your present state of health, do you have any difficulty performing the following activities?:   Driving? No  Managing money? No  Feeding yourself? No  Getting from bed to chair? No  Climbing a flight of stairs? No  Preparing food and eating?: No  Bathing or showering? No  Getting dressed: No  Getting to the toilet? No  Using the toilet:No  Moving around from place to place: No  In the past year have you fallen or had a near fall?:No  Are you sexually active? seldom Do you have more than one partner? No   Hearing Difficulties: No  Do you often ask people to speak up or repeat themselves? No  Do you experience ringing or noises in your ears? No  Do you have difficulty understanding soft or whispered voices? No  Do you feel that you have a problem with memory? No Do you often misplace items? No    Home Safety:  Do you have a smoke alarm at your residence? Yes Do you have grab bars in the bathroom? Do you have throw rugs in your house?   Cognitive Testing  Alert? Yes Normal Appearance?Yes  Oriented to person? Yes Place? Yes  Time? Yes  Recall of three objects? Yes  Can perform simple calculations? Yes  Displays appropriate judgment?Yes  Can read the correct time from a watch face?Yes   List the Names of Other Physician/Practitioners you currently use:  See referral list for the physicians patient is currently seeing.   Dr. Olevia Perches GI for colonoscopy Dr. Delman Cheadle, ophthalmologist Review of Systems: See above   Objective:     General appearance: Appears stated age and mildly obese  Head: Normocephalic, without obvious abnormality, atraumatic  Eyes: conj clear, EOMi PEERLA  Ears: normal TM's and external ear canals both ears  Nose: Nares normal. Septum midline. Mucosa normal. No drainage or sinus tenderness.  Throat: lips, mucosa, and tongue  normal; teeth and gums normal  Neck: no adenopathy, no carotid bruit, no JVD, supple, symmetrical, trachea midline and thyroid not enlarged, symmetric, no tenderness/mass/nodules  No CVA tenderness.  Lungs: clear to auscultation bilaterally  Breasts: normal appearance, no masses or tenderness. Heart: regular rate and rhythm, S1, S2 normal, no murmur, click, rub or gallop  Abdomen: soft, non-tender; bowel sounds normal; no masses, no organomegaly  Musculoskeletal: ROM normal in all joints, no crepitus, no deformity, Normal muscle strengthen. Back  is symmetric, no curvature. Skin: Skin color, texture, turgor normal. No rashes or lesions  Lymph nodes: Cervical, supraclavicular, and axillary nodes normal.  Neurologic: CN 2 -12 Normal, Normal symmetric reflexes. Normal coordination and gait  Psych: Alert & Oriented x 3, Mood appear stable.    Assessment:    Annual wellness medicare exam   Plan:    During the course of the visit the patient was educated and counseled about appropriate screening and preventive services including:   Reminded regarding repeat colonoscopy     Patient Instructions (the written plan) was given to the patient.  Medicare Attestation  I have personally reviewed:  The patient's medical and social history  Their  use of alcohol, tobacco or illicit drugs  Their current medications and supplements  The patient's functional ability including ADLs,fall risks, home safety risks, cognitive, and hearing and visual impairment  Diet and physical activities  Evidence for depression or mood disorders  The patient's weight, height, BMI, and visual acuity have been recorded in the chart. I have made referrals, counseling, and provided education to the patient based on review of the above and I have provided the patient with a written personalized care plan for preventive services.

## 2013-10-15 NOTE — Patient Instructions (Addendum)
Monitor blood pressure and advise if persistently elevated. Otherwise return in 6 months for office visit, lipid panel, liver functions and blood pressure check. Please have colonoscopy.

## 2013-11-08 ENCOUNTER — Other Ambulatory Visit: Payer: Self-pay | Admitting: Internal Medicine

## 2014-01-11 ENCOUNTER — Other Ambulatory Visit: Payer: Medicare Other | Admitting: Internal Medicine

## 2014-01-11 DIAGNOSIS — I10 Essential (primary) hypertension: Secondary | ICD-10-CM

## 2014-01-11 DIAGNOSIS — E785 Hyperlipidemia, unspecified: Secondary | ICD-10-CM

## 2014-01-11 LAB — HEPATIC FUNCTION PANEL
ALK PHOS: 65 U/L (ref 39–117)
ALT: 24 U/L (ref 0–53)
AST: 20 U/L (ref 0–37)
Albumin: 4.5 g/dL (ref 3.5–5.2)
BILIRUBIN INDIRECT: 0.4 mg/dL (ref 0.2–1.2)
Bilirubin, Direct: 0.2 mg/dL (ref 0.0–0.3)
TOTAL PROTEIN: 6.8 g/dL (ref 6.0–8.3)
Total Bilirubin: 0.6 mg/dL (ref 0.2–1.2)

## 2014-01-11 LAB — LIPID PANEL
Cholesterol: 118 mg/dL (ref 0–200)
HDL: 50 mg/dL (ref >39)
LDL CALC: 51 mg/dL (ref 0–99)
Total CHOL/HDL Ratio: 2.4 Ratio
Triglycerides: 86 mg/dL (ref <150)
VLDL: 17 mg/dL (ref 0–40)

## 2014-01-12 ENCOUNTER — Ambulatory Visit (INDEPENDENT_AMBULATORY_CARE_PROVIDER_SITE_OTHER): Payer: Medicare Other | Admitting: Internal Medicine

## 2014-01-12 ENCOUNTER — Encounter: Payer: Self-pay | Admitting: Internal Medicine

## 2014-01-12 VITALS — BP 120/80 | HR 78 | Ht 68.0 in | Wt 234.0 lb

## 2014-01-12 DIAGNOSIS — Z23 Encounter for immunization: Secondary | ICD-10-CM

## 2014-01-12 NOTE — Patient Instructions (Addendum)
Flu vaccine given. Continue same medications and return in 6 months. Recommend diet and exercise.

## 2014-01-16 NOTE — Progress Notes (Signed)
   Subjective:    Patient ID: Jimmy [name], male    DOB: 12/01/1940, 73 y.o.   MRN: [id number]  HPI  73 year old Male in today for six-month recheck of hypertension and hyperlipidemia. Feels well without any significant complaints. History of gout but no recent episodes of gout. 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 Hopes daughter will take over business in the near future. Family history of heart disease in his father. Patient denies chest pain.    Review of Systems     Objective:   Physical Exam  Skin warm and dry. Nodes none. Neck supple without JVD thyromegaly or carotid bruits. Chest clear to auscultation. Cardiac exam regular rate and rhythm normal S1 and S2. Extremities without edema.      Assessment & Plan:  Hyperlipidemia-lipid panel liver functions are within normal limits  Hypertension-stable on current regimen  History of gout  Plan: Continue same medications and return in 6 months. Flu vaccine given.  25 minutes spent with patient

## 2014-04-06 ENCOUNTER — Other Ambulatory Visit: Payer: Self-pay | Admitting: Internal Medicine

## 2014-05-17 ENCOUNTER — Ambulatory Visit (INDEPENDENT_AMBULATORY_CARE_PROVIDER_SITE_OTHER): Payer: Medicare Other | Admitting: Podiatry

## 2014-05-17 ENCOUNTER — Encounter: Payer: Self-pay | Admitting: Podiatry

## 2014-05-17 VITALS — BP 159/85 | HR 71 | Resp 13 | Ht 70.5 in | Wt 230.0 lb

## 2014-05-17 DIAGNOSIS — L84 Corns and callosities: Secondary | ICD-10-CM | POA: Diagnosis not present

## 2014-05-17 NOTE — Progress Notes (Signed)
   Subjective:    Patient ID: Jimmy [name], male    DOB: 1941/02/23, 74 y.o.   MRN: [id number]  HPI Comments: N Corns L Right lateral 5th toe > left D and O long-term, on and off C hard painful skin A enclosed shoes T Hx of periodic podiatric care  Patient presents  periodically for debridement of keratoses in the fifth toes since October 1999   the last visit for similar service and office was 06/11/2012          Review of Systems  All other systems reviewed and are negative.      Objective:   Physical Exam  Orientated 3 Keratoses fifth toes bilaterally right more organized and left      Assessment & Plan:   Assessment: Keratoses 2  Plan: Debrided keratoses 2 without a bleeding Apply salinocaine to the fifth right keratoses  Reappoint at patient's request

## 2014-07-02 ENCOUNTER — Other Ambulatory Visit: Payer: Self-pay | Admitting: Internal Medicine

## 2014-07-12 ENCOUNTER — Other Ambulatory Visit: Payer: Medicare Other | Admitting: Internal Medicine

## 2014-07-12 DIAGNOSIS — Z79899 Other long term (current) drug therapy: Secondary | ICD-10-CM

## 2014-07-12 DIAGNOSIS — R5383 Other fatigue: Secondary | ICD-10-CM

## 2014-07-12 DIAGNOSIS — E785 Hyperlipidemia, unspecified: Secondary | ICD-10-CM | POA: Diagnosis not present

## 2014-07-12 DIAGNOSIS — Z125 Encounter for screening for malignant neoplasm of prostate: Secondary | ICD-10-CM

## 2014-07-12 LAB — COMPLETE METABOLIC PANEL WITH GFR
ALK PHOS: 69 U/L (ref 39–117)
ALT: 19 U/L (ref 0–53)
AST: 19 U/L (ref 0–37)
Albumin: 4.5 g/dL (ref 3.5–5.2)
BILIRUBIN TOTAL: 0.6 mg/dL (ref 0.2–1.2)
BUN: 15 mg/dL (ref 6–23)
CO2: 26 mEq/L (ref 19–32)
CREATININE: 0.97 mg/dL (ref 0.50–1.35)
Calcium: 9.2 mg/dL (ref 8.4–10.5)
Chloride: 103 mEq/L (ref 96–112)
GFR, EST NON AFRICAN AMERICAN: 78 mL/min
GLUCOSE: 98 mg/dL (ref 70–99)
Potassium: 5 mEq/L (ref 3.5–5.3)
Sodium: 138 mEq/L (ref 135–145)
TOTAL PROTEIN: 6.8 g/dL (ref 6.0–8.3)

## 2014-07-12 LAB — CBC WITH DIFFERENTIAL/PLATELET
Basophils Absolute: 0 10*3/uL (ref 0.0–0.1)
Basophils Relative: 0 % (ref 0–1)
EOS ABS: 0 10*3/uL (ref 0.0–0.7)
Eosinophils Relative: 1 % (ref 0–5)
HCT: 45.7 % (ref 39.0–52.0)
Hemoglobin: 15.6 g/dL (ref 13.0–17.0)
LYMPHS ABS: 1.3 10*3/uL (ref 0.7–4.0)
LYMPHS PCT: 29 % (ref 12–46)
MCH: 32 pg (ref 26.0–34.0)
MCHC: 34.1 g/dL (ref 30.0–36.0)
MCV: 93.6 fL (ref 78.0–100.0)
MPV: 10.2 fL (ref 8.6–12.4)
Monocytes Absolute: 0.5 10*3/uL (ref 0.1–1.0)
Monocytes Relative: 10 % (ref 3–12)
Neutro Abs: 2.8 10*3/uL (ref 1.7–7.7)
Neutrophils Relative %: 60 % (ref 43–77)
PLATELETS: 189 10*3/uL (ref 150–400)
RBC: 4.88 MIL/uL (ref 4.22–5.81)
RDW: 14.1 % (ref 11.5–15.5)
WBC: 4.6 10*3/uL (ref 4.0–10.5)

## 2014-07-12 LAB — LIPID PANEL
CHOL/HDL RATIO: 2.7 ratio
Cholesterol: 139 mg/dL (ref 0–200)
HDL: 52 mg/dL (ref >=40)
LDL CALC: 62 mg/dL (ref 0–99)
Triglycerides: 123 mg/dL (ref <150)
VLDL: 25 mg/dL (ref 0–40)

## 2014-07-13 ENCOUNTER — Ambulatory Visit (INDEPENDENT_AMBULATORY_CARE_PROVIDER_SITE_OTHER): Payer: Medicare Other | Admitting: Internal Medicine

## 2014-07-13 ENCOUNTER — Encounter: Payer: Self-pay | Admitting: Internal Medicine

## 2014-07-13 VITALS — BP 148/80 | HR 89 | Temp 97.9°F | Ht 70.5 in | Wt 133.5 lb

## 2014-07-13 DIAGNOSIS — Z0000 Encounter for general adult medical examination without abnormal findings: Secondary | ICD-10-CM

## 2014-07-13 DIAGNOSIS — Z8639 Personal history of other endocrine, nutritional and metabolic disease: Secondary | ICD-10-CM | POA: Diagnosis not present

## 2014-07-13 DIAGNOSIS — Z8659 Personal history of other mental and behavioral disorders: Secondary | ICD-10-CM

## 2014-07-13 DIAGNOSIS — E785 Hyperlipidemia, unspecified: Secondary | ICD-10-CM

## 2014-07-13 DIAGNOSIS — I10 Essential (primary) hypertension: Secondary | ICD-10-CM | POA: Diagnosis not present

## 2014-07-13 DIAGNOSIS — Z8739 Personal history of other diseases of the musculoskeletal system and connective tissue: Secondary | ICD-10-CM

## 2014-07-13 LAB — POCT URINALYSIS DIPSTICK
BILIRUBIN UA: NEGATIVE
Blood, UA: NEGATIVE
Glucose, UA: NEGATIVE
KETONES UA: NEGATIVE
Leukocytes, UA: NEGATIVE
Nitrite, UA: NEGATIVE
PH UA: 7
Protein, UA: NEGATIVE
Spec Grav, UA: <=1.005
Urobilinogen, UA: NEGATIVE

## 2014-07-13 LAB — PSA, MEDICARE: PSA: 0.41 ng/mL (ref <=4.00)

## 2014-07-16 NOTE — Progress Notes (Signed)
Subjective:    Patient ID: Jimmy [name], male    DOB: 1941/02/25, 74 y.o.   MRN: [id number]  HPI  74 year old Male in today for Medicare wellness exam and evaluation of medical problems. History of gout, essential hypertension, hyperlipidemia, history of depression.  Had colonoscopy in 2003 by Dr. Delfin Edis.Had repeat study February 2014 which was normal with recommended 10 year follow-up. Had exercise tolerance test by Dr. Eustace Quail in 1998 which was negative.  Had Zostavax vaccine August 2010, Pneumovax 23 immunization March 2011. Tetanus immunization 2007. Gets annual influenza immunization.  History of herpes zoster right temple December 1998. History of cellulitis right foot 1996.  No known drug allergies.  Social history: Patient is divorced. He does not smoke. Social alcohol consumption. He has a steady girlfriend. Has adult children, 3 daughters and a stepson. He is Recruitment consultant.  Family history: Father with history of coronary artery disease status post CABG died with heart problems. Father had history of hypertension, gout, alcohol abuse. Mother had history of stroke and hypertension and died suddenly of an acute arrest perhaps stroke or MI. One sister died in a motor vehicle accident with history of alcoholism. 2 sisters in good health. 2 brothers, one with history of lymphoma doing well.    Review of Systems  Constitutional: Negative.   All other systems reviewed and are negative.      Objective:   Physical Exam  Constitutional: He is oriented to person, place, and time. He appears well-developed and well-nourished. No distress.  HENT:  Head: Normocephalic and atraumatic.  Right Ear: External ear normal.  Left Ear: External ear normal.  Mouth/Throat: Oropharynx is clear and moist.  Eyes: Conjunctivae and EOM are normal. Pupils are equal, round, and reactive to light. Right eye exhibits no discharge. Left eye exhibits no discharge. No  scleral icterus.  Neck: Neck supple. No JVD present. No thyromegaly present.  Cardiovascular: Normal rate, regular rhythm, normal heart sounds and intact distal pulses.   No murmur heard. Pulmonary/Chest: Effort normal and breath sounds normal. No respiratory distress. He has no wheezes. He has no rales.  Abdominal: Soft. Bowel sounds are normal. He exhibits no distension and no mass. There is no rebound and no guarding.  Genitourinary: Prostate normal.  Musculoskeletal: Normal range of motion. He exhibits no edema.  Lymphadenopathy:    He has no cervical adenopathy.  Neurological: He is alert and oriented to person, place, and time. He has normal reflexes. No cranial nerve deficit. Coordination normal.  Skin: Skin is warm and dry. No rash noted. He is not diaphoretic.  Psychiatric: He has a normal mood and affect. His behavior is normal. Judgment and thought content normal.  Vitals reviewed.         Assessment & Plan:  hypertension-stable on current regimen History of gout treated with allopurinol with no recent recurrenceHyperlipidemiastable on statin medication  Depressionstable on Prozac  Plan: Return in 6 months or as needed. Labs reviewed with patient including CBC, PSA, fasting lipid panel, complete metabolic panel were reviewed with patient and are entirely within normal limits.  Subjective:   Patient presents for Medicare Annual/Subsequent preventive examination.  Review Past Medical/Family/Social: see above   Risk Factors  Current exercise habits: walks on the job Dietary issues discussed: low fat low carb  Cardiac risk factors: Family Hx, Hyperlipidemia, HTN  Depression Screen  (Note: if answer to either of the following is "Yes", a more complete depression screening is indicated)  Over the past two weeks, have you felt down, depressed or hopeless? No  Over the past two weeks, have you felt little interest or pleasure in doing things? No Have you lost interest  or pleasure in daily life? No Do you often feel hopeless? No Do you cry easily over simple problems? No   Activities of Daily Living  In your present state of health, do you have any difficulty performing the following activities?:   Driving? No  Managing money? No  Feeding yourself? No  Getting from bed to chair? No  Climbing a flight of stairs? No  Preparing food and eating?: No  Bathing or showering? No  Getting dressed: No  Getting to the toilet? No  Using the toilet:No  Moving around from place to place: No  In the past year have you fallen or had a near fall?:No  Are you sexually active? sometimes  Do you have more than one partner? No   Hearing Difficulties: No  Do you often ask people to speak up or repeat themselves? No  Do you experience ringing or noises in your ears? occasionally Do you have difficulty understanding soft or whispered voices? No  Do you feel that you have a problem with memory? occasionally Do you often misplace items? No    Home Safety:  Do you have a smoke alarm at your residence? Yes Do you have grab bars in the bathroom? no Do you have throw rugs in your house? no   Cognitive Testing  Alert? Yes Normal Appearance?Yes  Oriented to person? Yes Place? Yes  Time? Yes  Recall of three objects? Yes  Can perform simple calculations? Yes  Displays appropriate judgment?Yes  Can read the correct time from a watch face?Yes   List the Names of Other Physician/Practitioners you currently use:  See referral list for the physicians patient is currently seeing.  Dr. Delman Cheadle, ophthalmologist   Review of Systems: See above  Objective:     General appearance: Appears stated age and mildly obese  Head: Normocephalic, without obvious abnormality, atraumatic  Eyes: conj clear, EOMi PEERLA  Ears: normal TM's and external ear canals both ears  Nose: Nares normal. Septum midline. Mucosa normal. No drainage or sinus tenderness.  Throat: lips, mucosa,  and tongue normal; teeth and gums normal  Neck: no adenopathy, no carotid bruit, no JVD, supple, symmetrical, trachea midline and thyroid not enlarged, symmetric, no tenderness/mass/nodules  No CVA tenderness.  Lungs: clear to auscultation bilaterally  Breasts: normal appearance, no masses or tenderness Heart: regular rate and rhythm, S1, S2 normal, no murmur, click, rub or gallop  Abdomen: soft, non-tender; bowel sounds normal; no masses, no organomegaly  Musculoskeletal: ROM normal in all joints, no crepitus, no deformity, Normal muscle strengthen. Back  is symmetric, no curvature. Skin: Skin color, texture, turgor normal. No rashes or lesions  Lymph nodes: Cervical, supraclavicular, and axillary nodes normal.  Neurologic: CN 2 -12 Normal, Normal symmetric reflexes. Normal coordination and gait  Psych: Alert & Oriented x 3, Mood appear stable.    Assessment:    Annual wellness medicare exam   Plan:    During the course of the visit the patient was educated and counseled about appropriate screening and preventive services including:   To receive Prevnar 13 in the near future     Patient Instructions (the written plan) was given to the patient.  Medicare Attestation  I have personally reviewed:  The patient's medical and social history  Their use of alcohol,  tobacco or illicit drugs  Their current medications and supplements  The patient's functional ability including ADLs,fall risks, home safety risks, cognitive, and hearing and visual impairment  Diet and physical activities  Evidence for depression or mood disorders  The patient's weight, height, BMI, and visual acuity have been recorded in the chart. I have made referrals, counseling, and provided education to the patient based on review of the above and I have provided the patient with a written personalized care plan for preventive services.

## 2014-07-16 NOTE — Patient Instructions (Signed)
Try to get plenty of exercise. Watch diet. Return in 6 months. Continue same medications. It was a pleasure to see today.

## 2014-07-22 ENCOUNTER — Other Ambulatory Visit: Payer: Self-pay | Admitting: Internal Medicine

## 2014-07-22 DIAGNOSIS — R1011 Right upper quadrant pain: Secondary | ICD-10-CM

## 2014-10-31 ENCOUNTER — Other Ambulatory Visit: Payer: Self-pay | Admitting: Internal Medicine

## 2014-11-01 ENCOUNTER — Encounter: Payer: Self-pay | Admitting: Internal Medicine

## 2014-11-01 ENCOUNTER — Ambulatory Visit (INDEPENDENT_AMBULATORY_CARE_PROVIDER_SITE_OTHER): Payer: Medicare Other | Admitting: Internal Medicine

## 2014-11-01 VITALS — BP 134/84 | HR 81 | Temp 97.9°F | Wt 234.0 lb

## 2014-11-01 DIAGNOSIS — I10 Essential (primary) hypertension: Secondary | ICD-10-CM

## 2014-11-01 DIAGNOSIS — J069 Acute upper respiratory infection, unspecified: Secondary | ICD-10-CM | POA: Diagnosis not present

## 2014-11-01 MED ORDER — AZITHROMYCIN 250 MG PO TABS: ORAL_TABLET | ORAL | Status: DC

## 2014-11-01 MED ORDER — HYDROCODONE-HOMATROPINE 5-1.5 MG/5ML PO SYRP: 5.0000 mL | ORAL_SOLUTION | Freq: Three times a day (TID) | ORAL | Status: DC | PRN

## 2014-11-01 NOTE — Patient Instructions (Signed)
Take Zithromax Z-PAK as directed. Take Hycodan as needed for cough. Rest and drink plenty of fluids.

## 2014-11-01 NOTE — Progress Notes (Signed)
   Subjective:    Patient ID: Jimmy [name], male    DOB: [birth year], 74 y.o.   MRN: [id number]  HPI  His girlfriend has had recent respiratory infection. He came down with symptoms about 2 days ago. Has had low-grade fever and malaise and fatigue. Has had a lot of coughing particularly last night. No sore throat.    Review of Systems     Objective:   Physical Exam  Neck is supple without JVD thyromegaly or carotid bruits. Pharynx slightly injected. TMs are full bilaterally but not red. No cervical adenopathy. Chest clear to auscultation without rales or wheezing.      Assessment & Plan:  Acute URI  Plan: Zithromax Z-PAK take 2 tablets day one followed by 1 tablet days 2 through 5. Hycodan 1 teaspoon by mouth every 8 hours when necessary cough.

## 2014-11-18 ENCOUNTER — Ambulatory Visit (INDEPENDENT_AMBULATORY_CARE_PROVIDER_SITE_OTHER): Payer: Medicare Other | Admitting: Internal Medicine

## 2014-11-18 VITALS — BP 130/80 | HR 80

## 2014-11-18 DIAGNOSIS — Z23 Encounter for immunization: Secondary | ICD-10-CM | POA: Diagnosis not present

## 2014-11-18 NOTE — Progress Notes (Signed)
Patient presents today for Prevnar vaccine. VS stable. Patient tolerated injection well.

## 2015-01-11 ENCOUNTER — Other Ambulatory Visit: Payer: Medicare Other | Admitting: Internal Medicine

## 2015-01-11 ENCOUNTER — Other Ambulatory Visit: Payer: Self-pay | Admitting: Internal Medicine

## 2015-01-11 DIAGNOSIS — Z13220 Encounter for screening for lipoid disorders: Secondary | ICD-10-CM | POA: Diagnosis not present

## 2015-01-11 DIAGNOSIS — E785 Hyperlipidemia, unspecified: Secondary | ICD-10-CM | POA: Diagnosis not present

## 2015-01-11 LAB — HEPATIC FUNCTION PANEL
ALBUMIN: 4.5 g/dL (ref 3.6–5.1)
ALT: 23 U/L (ref 9–46)
AST: 22 U/L (ref 10–35)
Alkaline Phosphatase: 72 U/L (ref 40–115)
Bilirubin, Direct: 0.1 mg/dL (ref <=0.2)
Indirect Bilirubin: 0.6 mg/dL (ref 0.2–1.2)
TOTAL PROTEIN: 7 g/dL (ref 6.1–8.1)
Total Bilirubin: 0.7 mg/dL (ref 0.2–1.2)

## 2015-01-11 LAB — LIPID PANEL
CHOLESTEROL: 155 mg/dL (ref 125–200)
HDL: 45 mg/dL (ref >=40)
LDL Cholesterol: 76 mg/dL (ref <130)
TRIGLYCERIDES: 171 mg/dL — AB (ref <150)
Total CHOL/HDL Ratio: 3.4 Ratio (ref <=5.0)
VLDL: 34 mg/dL — ABNORMAL HIGH (ref <30)

## 2015-01-13 ENCOUNTER — Ambulatory Visit (INDEPENDENT_AMBULATORY_CARE_PROVIDER_SITE_OTHER): Payer: Medicare Other | Admitting: Internal Medicine

## 2015-01-13 ENCOUNTER — Encounter: Payer: Self-pay | Admitting: Internal Medicine

## 2015-01-13 VITALS — BP 132/84 | HR 69 | Temp 97.5°F | Ht 71.0 in | Wt 234.0 lb

## 2015-01-13 DIAGNOSIS — Z8639 Personal history of other endocrine, nutritional and metabolic disease: Secondary | ICD-10-CM

## 2015-01-13 DIAGNOSIS — Z8659 Personal history of other mental and behavioral disorders: Secondary | ICD-10-CM

## 2015-01-13 DIAGNOSIS — Z23 Encounter for immunization: Secondary | ICD-10-CM

## 2015-01-13 DIAGNOSIS — E785 Hyperlipidemia, unspecified: Secondary | ICD-10-CM | POA: Diagnosis not present

## 2015-01-13 DIAGNOSIS — Z8739 Personal history of other diseases of the musculoskeletal system and connective tissue: Secondary | ICD-10-CM

## 2015-01-13 DIAGNOSIS — I10 Essential (primary) hypertension: Secondary | ICD-10-CM

## 2015-01-15 NOTE — Progress Notes (Signed)
   Subjective:    Patient ID: Jimmy [name], male    DOB: May 19, 1940, 73 y.o.   MRN: [id number]  HPI 74 year old Male in today for six-month recheck on essential hypertension, depression, hyperlipidemia. Blood pressure under excellent control at 132/84 with current regimen. He has a history of gout treated with allopurinol. No recent issues with gout. No new complaints or problems. Remains on Mavik, Prozac, allopurinol, Zocor.    Review of Systems     Objective:   Physical Exam Skin warm and dry. Nodes none. Neck is supple without JVD, thyromegaly or carotid bruits. Chest clear to auscultation. Cardiac exam regular rate and rhythm normal S1 and S2. Extremities without edema.       Assessment & Plan:  Essential hypertension  Hyperlipidemia  History of gout  History of depression-stable on Prozac  Plan: Continue current medications as previously prescribed. Return in 6 months for physical examination. Flu vaccine given.

## 2015-01-15 NOTE — Patient Instructions (Addendum)
It was a pleasure to see you today. Continue same medications and return in 6 months for physical exam. Flu vaccine given.

## 2015-02-07 DIAGNOSIS — H2513 Age-related nuclear cataract, bilateral: Secondary | ICD-10-CM | POA: Diagnosis not present

## 2015-03-29 ENCOUNTER — Other Ambulatory Visit: Payer: Self-pay | Admitting: Internal Medicine

## 2015-06-23 ENCOUNTER — Other Ambulatory Visit: Payer: Self-pay | Admitting: Internal Medicine

## 2015-07-18 ENCOUNTER — Other Ambulatory Visit: Payer: Medicare Other | Admitting: Internal Medicine

## 2015-07-18 DIAGNOSIS — Z79899 Other long term (current) drug therapy: Secondary | ICD-10-CM | POA: Diagnosis not present

## 2015-07-18 DIAGNOSIS — Z0000 Encounter for general adult medical examination without abnormal findings: Secondary | ICD-10-CM | POA: Diagnosis not present

## 2015-07-18 DIAGNOSIS — E785 Hyperlipidemia, unspecified: Secondary | ICD-10-CM

## 2015-07-18 DIAGNOSIS — Z125 Encounter for screening for malignant neoplasm of prostate: Secondary | ICD-10-CM

## 2015-07-18 LAB — CBC WITH DIFFERENTIAL/PLATELET
BASOS PCT: 0 % (ref 0–1)
Basophils Absolute: 0 10*3/uL (ref 0.0–0.1)
EOS ABS: 0.1 10*3/uL (ref 0.0–0.7)
EOS PCT: 3 % (ref 0–5)
HCT: 42.2 % (ref 39.0–52.0)
HEMOGLOBIN: 14.7 g/dL (ref 13.0–17.0)
Lymphocytes Relative: 28 % (ref 12–46)
Lymphs Abs: 1.2 10*3/uL (ref 0.7–4.0)
MCH: 33.1 pg (ref 26.0–34.0)
MCHC: 34.8 g/dL (ref 30.0–36.0)
MCV: 95 fL (ref 78.0–100.0)
MONO ABS: 0.8 10*3/uL (ref 0.1–1.0)
MONOS PCT: 18 % — AB (ref 3–12)
MPV: 9.8 fL (ref 8.6–12.4)
NEUTROS ABS: 2.1 10*3/uL (ref 1.7–7.7)
Neutrophils Relative %: 51 % (ref 43–77)
Platelets: 161 10*3/uL (ref 150–400)
RBC: 4.44 MIL/uL (ref 4.22–5.81)
RDW: 14 % (ref 11.5–15.5)
WBC: 4.2 10*3/uL (ref 4.0–10.5)

## 2015-07-18 LAB — COMPLETE METABOLIC PANEL WITH GFR
ALBUMIN: 4.2 g/dL (ref 3.6–5.1)
ALK PHOS: 60 U/L (ref 40–115)
ALT: 20 U/L (ref 9–46)
AST: 22 U/L (ref 10–35)
BILIRUBIN TOTAL: 0.6 mg/dL (ref 0.2–1.2)
BUN: 14 mg/dL (ref 7–25)
CHLORIDE: 100 mmol/L (ref 98–110)
CO2: 26 mmol/L (ref 20–31)
Calcium: 8.9 mg/dL (ref 8.6–10.3)
Creat: 0.87 mg/dL (ref 0.70–1.18)
GFR, EST NON AFRICAN AMERICAN: 86 mL/min (ref >=60)
GFR, Est African American: >89 mL/min (ref >=60)
GLUCOSE: 87 mg/dL (ref 65–99)
POTASSIUM: 4.4 mmol/L (ref 3.5–5.3)
SODIUM: 139 mmol/L (ref 135–146)
Total Protein: 6.5 g/dL (ref 6.1–8.1)

## 2015-07-18 LAB — LIPID PANEL
Cholesterol: 121 mg/dL — ABNORMAL LOW (ref 125–200)
HDL: 47 mg/dL (ref >=40)
LDL Cholesterol: 56 mg/dL (ref <130)
Total CHOL/HDL Ratio: 2.6 Ratio (ref <=5.0)
Triglycerides: 88 mg/dL (ref <150)
VLDL: 18 mg/dL (ref <30)

## 2015-07-19 ENCOUNTER — Ambulatory Visit (INDEPENDENT_AMBULATORY_CARE_PROVIDER_SITE_OTHER): Payer: Medicare Other | Admitting: Internal Medicine

## 2015-07-19 ENCOUNTER — Encounter: Payer: Self-pay | Admitting: Internal Medicine

## 2015-07-19 VITALS — BP 128/74 | HR 79 | Temp 97.9°F | Resp 16 | Ht 71.0 in | Wt 238.0 lb

## 2015-07-19 DIAGNOSIS — I10 Essential (primary) hypertension: Secondary | ICD-10-CM | POA: Diagnosis not present

## 2015-07-19 DIAGNOSIS — E785 Hyperlipidemia, unspecified: Secondary | ICD-10-CM

## 2015-07-19 DIAGNOSIS — Z8659 Personal history of other mental and behavioral disorders: Secondary | ICD-10-CM

## 2015-07-19 DIAGNOSIS — E8881 Metabolic syndrome: Secondary | ICD-10-CM

## 2015-07-19 DIAGNOSIS — Z8739 Personal history of other diseases of the musculoskeletal system and connective tissue: Secondary | ICD-10-CM

## 2015-07-19 DIAGNOSIS — Z8639 Personal history of other endocrine, nutritional and metabolic disease: Secondary | ICD-10-CM

## 2015-07-19 DIAGNOSIS — E669 Obesity, unspecified: Secondary | ICD-10-CM

## 2015-07-19 DIAGNOSIS — Z0000 Encounter for general adult medical examination without abnormal findings: Secondary | ICD-10-CM

## 2015-07-19 LAB — POCT URINALYSIS DIPSTICK
Bilirubin, UA: NEGATIVE
Blood, UA: NEGATIVE
Glucose, UA: NEGATIVE
KETONES UA: NEGATIVE
Leukocytes, UA: NEGATIVE
Nitrite, UA: NEGATIVE
PH UA: 7
PROTEIN UA: NEGATIVE
SPEC GRAV UA: 1.01
Urobilinogen, UA: 0.2

## 2015-07-19 LAB — PSA: PSA: 0.78 ng/mL (ref <=4.00)

## 2015-07-19 NOTE — Patient Instructions (Signed)
It is a pleasure to see today. Continue same medications and return in 6 months.

## 2015-07-19 NOTE — Progress Notes (Signed)
Subjective:    Patient ID: Jimmy [name], male    DOB: September 25, 1940, 75 y.o.   MRN: [id number]  HPI 75 year old male in today for health maintenance exam and evaluation of medical issues. History of gout, essential hypertension, hyperlipidemia and history of depression.  Had colonoscopy February 2014 which was normal. Repeat study recommended in 10 years. Had exercise tolerance test by Dr. Eustace Quail in 1998 which was negative.  Zostavax vaccine August 2010. Pneumovax 23 immunization March 2011. Tetanus immunization 2007. Gets annual flu vaccine. Has had Prevnar 13 in July 2016.  History of herpes zoster right temple 1998. History of cellulitis right foot 1996.  Marland Kitchen Social history: He is divorced. Does not smoke. Social alcohol consumption. He has a steady lady friend. Has adult children, 3 daughters and a stepson. He is Recruitment consultant.  Family history: Father with history of coronary artery disease status post CABG died with heart problems. Father had hypertension, gout, alcohol abuse. Mother had history of stroke and hypertension and died suddenly of an acute arrest perhaps stroke or MI. One sister died in a motor vehicle accident with history of alcoholism. 2 sisters in good health. 2 brothers, one with history of lymphoma doing well.    Review of Systems  Constitutional: Negative.   All other systems reviewed and are negative.      Objective:   Physical Exam  Constitutional: He is oriented to person, place, and time. He appears well-developed and well-nourished. No distress.  HENT:  Head: Normocephalic and atraumatic.  Right Ear: External ear normal.  Left Ear: External ear normal.  Mouth/Throat: Oropharynx is clear and moist. No oropharyngeal exudate.  Eyes: Conjunctivae and EOM are normal. Pupils are equal, round, and reactive to light. No scleral icterus.  Neck: Neck supple. No JVD present. No thyromegaly present.  Cardiovascular: Normal rate.     No murmur heard. Pulmonary/Chest: Breath sounds normal. No respiratory distress. He has no wheezes. He has no rales. He exhibits no tenderness.  Abdominal: Soft. Bowel sounds are normal. He exhibits no distension and no mass. There is no tenderness. There is no rebound and no guarding.  Genitourinary: Prostate normal.  Musculoskeletal: He exhibits no edema.  Lymphadenopathy:    He has no cervical adenopathy.  Neurological: He is alert and oriented to person, place, and time. He has normal reflexes. He displays normal reflexes. No cranial nerve deficit. Coordination normal.  Skin: Skin is warm and dry. No rash noted. He is not diaphoretic.  Psychiatric: He has a normal mood and affect. His behavior is normal. Judgment and thought content normal.  Vitals reviewed.         Assessment & Plan:  Normal health maintenance exam  Hyperlipidemia-stable on statin therapy  History depression- stable on Prozac  Hypertension-stable on current regimen  History of gout treated with allopurinol with no recent recurrence  Plan: Return in 6 months or as needed.  Subjective:   Patient presents for Medicare Annual/Subsequent preventive examination.  Review Past Medical/Family/Social:See above   Risk Factors  Current exercise habits: Walks on job sites  Dietary issues discussed: Low fat low carbohydrate  Cardiac risk factors:Family history, hyperlipidemia, history of hypertension  Depression Screen  (Note: if answer to either of the following is "Yes", a more complete depression screening is indicated)   Over the past two weeks, have you felt down, depressed or hopeless? No  Over the past two weeks, have you felt little interest or pleasure in doing things?  No Have you lost interest or pleasure in daily life? No Do you often feel hopeless? No Do you cry easily over simple problems? No   Activities of Daily Living  In your present state of health, do you have any difficulty performing  the following activities?:   Driving? No  Managing money? No  Feeding yourself? No  Getting from bed to chair? No  Climbing a flight of stairs? No  Preparing food and eating?: No  Bathing or showering? No  Getting dressed: No  Getting to the toilet? No  Using the toilet:No  Moving around from place to place: No  In the past year have you fallen or had a near fall?:No     Hearing Difficulties: No  Do you often ask people to speak up or repeat themselves? No  Do you experience ringing or noises in your ears? No  Do you have difficulty understanding soft or whispered voices? No  Do you feel that you have a problem with memory? No Do you often misplace items? No    Home Safety:  Do you have a smoke alarm at your residence? Yes Do you have grab bars in the bathroom? No Do you have throw rugs in your house? No   Cognitive Testing  Alert? Yes Normal Appearance?Yes  Oriented to person? Yes Place? Yes  Time? Yes  Recall of three objects? Yes  Can perform simple calculations? Yes  Displays appropriate judgment?Yes  Can read the correct time from a watch face?Yes   List the Names of Other Physician/Practitioners you currently use:  See referral list for the physicians patient is currently seeing.  Dr. Delman Cheadle, ophthalmologist   Review of Systems: See above   Objective:     General appearance: Appears stated age and mildly obese  Head: Normocephalic, without obvious abnormality, atraumatic  Eyes: conj clear, EOMi PEERLA  Ears: normal TM's and external ear canals both ears  Nose: Nares normal. Septum midline. Mucosa normal. No drainage or sinus tenderness.  Throat: lips, mucosa, and tongue normal; teeth and gums normal  Neck: no adenopathy, no carotid bruit, no JVD, supple, symmetrical, trachea midline and thyroid not enlarged, symmetric, no tenderness/mass/nodules  No CVA tenderness.  Lungs: clear to auscultation bilaterally  Breasts: normal appearance Heart: regular  rate and rhythm, S1, S2 normal, no murmur, click, rub or gallop  Abdomen: soft, non-tender; bowel sounds normal; no masses, no organomegaly  Musculoskeletal: ROM normal in all joints, no crepitus, no deformity, Normal muscle strengthen. Back  is symmetric, no curvature. Skin: Skin color, texture, turgor normal. No rashes or lesions  Lymph nodes: Cervical, supraclavicular, and axillary nodes normal.  Neurologic: CN 2 -12 Normal, Normal symmetric reflexes. Normal coordination and gait  Psych: Alert & Oriented x 3, Mood appear stable.    Assessment:    Annual wellness medicare exam   Plan:    During the course of the visit the patient was educated and counseled about appropriate screening and preventive services including:  Annual flu vaccine annual PSA so sick of this      Patient Instructions (the written plan) was given to the patient.  Medicare Attestation  I have personally reviewed:  The patient's medical and social history  Their use of alcohol, tobacco or illicit drugs  Their current medications and supplements  The patient's functional ability including ADLs,fall risks, home safety risks, cognitive, and hearing and visual impairment  Diet and physical activities  Evidence for depression or mood disorders  The patient's weight,  height, BMI, and visual acuity have been recorded in the chart. I have made referrals, counseling, and provided education to the patient based on review of the above and I have provided the patient with a written personalized care plan for preventive services.

## 2015-09-21 ENCOUNTER — Other Ambulatory Visit: Payer: Self-pay | Admitting: Internal Medicine

## 2016-01-12 ENCOUNTER — Other Ambulatory Visit: Payer: Medicare Other | Admitting: Internal Medicine

## 2016-01-12 ENCOUNTER — Ambulatory Visit (INDEPENDENT_AMBULATORY_CARE_PROVIDER_SITE_OTHER): Payer: Medicare Other | Admitting: Internal Medicine

## 2016-01-12 VITALS — Temp 96.9°F

## 2016-01-12 DIAGNOSIS — Z23 Encounter for immunization: Secondary | ICD-10-CM

## 2016-01-12 DIAGNOSIS — E785 Hyperlipidemia, unspecified: Secondary | ICD-10-CM | POA: Diagnosis not present

## 2016-01-12 LAB — HEPATIC FUNCTION PANEL
ALBUMIN: 4.6 g/dL (ref 3.6–5.1)
ALK PHOS: 62 U/L (ref 40–115)
ALT: 20 U/L (ref 9–46)
AST: 21 U/L (ref 10–35)
BILIRUBIN DIRECT: 0.2 mg/dL (ref <=0.2)
BILIRUBIN TOTAL: 0.8 mg/dL (ref 0.2–1.2)
Indirect Bilirubin: 0.6 mg/dL (ref 0.2–1.2)
Total Protein: 6.9 g/dL (ref 6.1–8.1)

## 2016-01-12 LAB — LIPID PANEL
Cholesterol: 142 mg/dL (ref 125–200)
HDL: 49 mg/dL (ref >=40)
LDL CALC: 69 mg/dL (ref <130)
Total CHOL/HDL Ratio: 2.9 Ratio (ref <=5.0)
Triglycerides: 122 mg/dL (ref <150)
VLDL: 24 mg/dL (ref <30)

## 2016-01-19 ENCOUNTER — Ambulatory Visit (INDEPENDENT_AMBULATORY_CARE_PROVIDER_SITE_OTHER): Payer: Medicare Other | Admitting: Internal Medicine

## 2016-01-19 ENCOUNTER — Encounter: Payer: Self-pay | Admitting: Internal Medicine

## 2016-01-19 VITALS — BP 132/88 | HR 72 | Temp 97.7°F | Wt 239.5 lb

## 2016-01-19 DIAGNOSIS — R030 Elevated blood-pressure reading, without diagnosis of hypertension: Secondary | ICD-10-CM | POA: Diagnosis not present

## 2016-01-19 DIAGNOSIS — E785 Hyperlipidemia, unspecified: Secondary | ICD-10-CM | POA: Diagnosis not present

## 2016-01-19 DIAGNOSIS — I10 Essential (primary) hypertension: Secondary | ICD-10-CM

## 2016-01-19 DIAGNOSIS — IMO0001 Reserved for inherently not codable concepts without codable children: Secondary | ICD-10-CM

## 2016-01-19 NOTE — Patient Instructions (Signed)
Monitor blood pressure at home over the next 2 weeks and call with readings. Continue statin medication for hyperlipidemia. Physical exam is due in 6 months.

## 2016-01-19 NOTE — Progress Notes (Signed)
   Subjective:    Patient ID: Jimmy [name], male    DOB: Oct 29, 1940, 75 y.o.   MRN: [id number]  HPI 75 year old white male in today for six-month follow-up on essential hypertension and hyperlipidemia. He is on statin therapy and Mavik one half of a 2 mg tablet for hypertension. He's been on [facility] for a long time. His blood pressure is elevated today. We took it several different times and it was ranging slightly over [id number] Systolic and in the 90 range diastolically. Previously his blood pressures have been much better for the most part. He's not been monitoring his blood pressure at home for about a year. He will do this over the next couple of weeks and give me a call with readings. We might want to increase his Mavik to 2 mg daily i.e. a whole tablet instead of a half tablet.  He had flu vaccine recently through this office.  His lipid panel and liver functions are entirely within normal limits on statin therapy.  He has no new complaints or problems.    Review of Systems as above     Objective:   Physical Exam  Skin warm and dry. Nodes none. Neck is supple without JVD thyromegaly or carotid bruits. Chest clear to auscultation. Cardiac exam regular rate and rhythm normal S1 and S2. Extremities without edema      Assessment & Plan:  Essential hypertension-monitor blood pressure at home over the next 2 weeks and call with readings  Hyperlipidemia-stable on statin therapy  Plan: Physical exam due in 6 months

## 2016-04-04 DIAGNOSIS — H2513 Age-related nuclear cataract, bilateral: Secondary | ICD-10-CM | POA: Diagnosis not present

## 2016-04-09 ENCOUNTER — Other Ambulatory Visit: Payer: Self-pay | Admitting: Internal Medicine

## 2016-04-18 ENCOUNTER — Encounter: Payer: Self-pay | Admitting: Podiatry

## 2016-04-18 ENCOUNTER — Ambulatory Visit (INDEPENDENT_AMBULATORY_CARE_PROVIDER_SITE_OTHER): Payer: Medicare Other | Admitting: Podiatry

## 2016-04-18 VITALS — BP 162/85 | HR 82 | Resp 18

## 2016-04-18 DIAGNOSIS — L84 Corns and callosities: Secondary | ICD-10-CM

## 2016-04-18 NOTE — Progress Notes (Signed)
   Subjective:    Patient ID: Jimmy [name], male    DOB: November 16, 1940, 75 y.o.   MRN: [id number]  HPI    This patient presents today complaining of a painful corn on the fifth right toe, becoming progressively more painful the last several months. Patient says that this lesion extremely painful causing him to limp when he walks and wear shoes. He has attempted self treatment, however the lesion and discomfort persist. Patient presented to our office on 05/17/2014 for this same complaint and at that time the corn was debrided and patient described immediate relief. Patient is requesting similar treatment today.  Review of Systems  All other systems reviewed and are negative.      Objective:   Physical Exam  Orientated 3  Vascular: DP and PT pulses 2/4 bilaterally Apri reflex immediate bilaterally  Neurological: Sensation to 10 g monofilament wire intact 5/5 bilaterally Vibratory sensation reactive bilaterally Ankle reflex equal and reactive bilaterally  Dermatological: No open skin lesions bilaterally Well-organized nucleated keratoses lateral PIPJ fifth right toe  Musculoskeletal: Hammertoes fifth bilaterally Manual motor testing dorsi flexion, plantar flexion, inversion, eversion 5/5 bilaterally      Assessment & Plan:   Assessment: Satisfactory neurovascular status Hammertoe fifth right with associated painful corn  Plan: Debride corn fifth right toe without any bleeding and apply salinocaine to the lesion. Patient will return as needed for follow-up

## 2016-04-18 NOTE — Patient Instructions (Signed)
Corns and Calluses Introduction Corns are small areas of thickened skin that occur on the top, sides, or tip of a toe. They contain a cone-shaped core with a point that can press on a nerve below. This causes pain. Calluses are areas of thickened skin that can occur anywhere on the body including hands, fingers, palms, soles of the feet, and heels.Calluses are usually larger than corns. What are the causes? Corns and calluses are caused by rubbing (friction) or pressure, such as from shoes that are too tight or do not fit properly. What increases the risk? Corns are more likely to develop in people who have toe deformities, such as hammer toes. Since calluses can occur with friction to any area of the skin, calluses are more likely to develop in people who:  Work with their hands.  Wear shoes that fit poorly, shoes that are too tight, or shoes that are high-heeled.  Have toes deformities. What are the signs or symptoms? Symptoms of a corn or callus include:  A hard growth on the skin.  Pain or tenderness under the skin.  Redness and swelling.  Increased discomfort while wearing tight-fitting shoes. How is this diagnosed? Corns and calluses may be diagnosed with a medical history and physical exam. How is this treated? Corns and calluses may be treated with:  Removing the cause of the friction or pressure. This may include:  Changing your shoes.  Wearing shoe inserts (orthotics) or other protective layers in your shoes, such as a corn pad.  Wearing gloves.  Medicines to help soften skin in the hardened, thickened areas.  Reducing the size of the corn or callus by removing the dead layers of skin.  Antibiotic medicines to treat infection.  Surgery, if a toe deformity is the cause. Follow these instructions at home:  Take medicines only as directed by your health care provider.  If you were prescribed an antibiotic, finish all of it even if you start to feel  better.  Wear shoes that fit well. Avoid wearing high-heeled shoes and shoes that are too tight or too loose.  Wear any padding, protective layers, gloves, or orthotics as directed by your health care provider.  Soak your hands or feet and then use a file or pumice stone to soften your corn or callus. Do this as directed by your health care provider.  Check your corn or callus every day for signs of infection. Watch for:  Redness, swelling, or pain.  Fluid, blood, or pus. Contact a health care provider if:  Your symptoms do not improve with treatment.  You have increased redness, swelling, or pain at the site of your corn or callus.  You have fluid, blood, or pus coming from your corn or callus.  You have new symptoms. This information is not intended to replace advice given to you by your health care provider. Make sure you discuss any questions you have with your health care provider. Document Released: 01/14/2004 Document Revised: 10/28/2015 Document Reviewed: 04/05/2014  2017 Elsevier

## 2016-06-17 ENCOUNTER — Other Ambulatory Visit: Payer: Self-pay | Admitting: Internal Medicine

## 2016-08-05 ENCOUNTER — Other Ambulatory Visit: Payer: Self-pay | Admitting: Internal Medicine

## 2016-09-04 ENCOUNTER — Other Ambulatory Visit: Payer: Medicare Other | Admitting: Internal Medicine

## 2016-09-04 DIAGNOSIS — Z125 Encounter for screening for malignant neoplasm of prostate: Secondary | ICD-10-CM | POA: Diagnosis not present

## 2016-09-04 DIAGNOSIS — Z0000 Encounter for general adult medical examination without abnormal findings: Secondary | ICD-10-CM | POA: Diagnosis not present

## 2016-09-04 DIAGNOSIS — E785 Hyperlipidemia, unspecified: Secondary | ICD-10-CM

## 2016-09-04 LAB — COMPREHENSIVE METABOLIC PANEL
ALT: 19 U/L (ref 9–46)
AST: 18 U/L (ref 10–35)
Albumin: 4.3 g/dL (ref 3.6–5.1)
Alkaline Phosphatase: 67 U/L (ref 40–115)
BUN: 13 mg/dL (ref 7–25)
CHLORIDE: 103 mmol/L (ref 98–110)
CO2: 27 mmol/L (ref 20–31)
Calcium: 9.4 mg/dL (ref 8.6–10.3)
Creat: 1.05 mg/dL (ref 0.70–1.18)
GLUCOSE: 95 mg/dL (ref 65–99)
POTASSIUM: 5 mmol/L (ref 3.5–5.3)
Sodium: 138 mmol/L (ref 135–146)
Total Bilirubin: 0.8 mg/dL (ref 0.2–1.2)
Total Protein: 7 g/dL (ref 6.1–8.1)

## 2016-09-04 LAB — CBC WITH DIFFERENTIAL/PLATELET
BASOS ABS: 50 {cells}/uL (ref 0–200)
BASOS PCT: 1 %
EOS ABS: 100 {cells}/uL (ref 15–500)
Eosinophils Relative: 2 %
HCT: 45.7 % (ref 38.5–50.0)
Hemoglobin: 15.3 g/dL (ref 13.2–17.1)
LYMPHS PCT: 31 %
Lymphs Abs: 1550 cells/uL (ref 850–3900)
MCH: 31.4 pg (ref 27.0–33.0)
MCHC: 33.5 g/dL (ref 32.0–36.0)
MCV: 93.6 fL (ref 80.0–100.0)
MONOS PCT: 11 %
MPV: 9.9 fL (ref 7.5–12.5)
Monocytes Absolute: 550 cells/uL (ref 200–950)
Neutro Abs: 2750 cells/uL (ref 1500–7800)
Neutrophils Relative %: 55 %
PLATELETS: 177 10*3/uL (ref 140–400)
RBC: 4.88 MIL/uL (ref 4.20–5.80)
RDW: 14.2 % (ref 11.0–15.0)
WBC: 5 10*3/uL (ref 3.8–10.8)

## 2016-09-04 LAB — LIPID PANEL
CHOL/HDL RATIO: 3 ratio (ref <5.0)
Cholesterol: 145 mg/dL (ref <200)
HDL: 49 mg/dL (ref >40)
LDL Cholesterol: 72 mg/dL (ref <100)
Triglycerides: 119 mg/dL (ref <150)
VLDL: 24 mg/dL (ref <30)

## 2016-09-04 LAB — PSA: PSA: 0.5 ng/mL (ref <=4.0)

## 2016-09-06 ENCOUNTER — Encounter: Payer: Self-pay | Admitting: Internal Medicine

## 2016-09-06 ENCOUNTER — Ambulatory Visit (INDEPENDENT_AMBULATORY_CARE_PROVIDER_SITE_OTHER): Payer: Medicare Other | Admitting: Internal Medicine

## 2016-09-06 VITALS — BP 128/80 | HR 70 | Temp 97.6°F | Ht 67.5 in | Wt 234.0 lb

## 2016-09-06 DIAGNOSIS — E784 Other hyperlipidemia: Secondary | ICD-10-CM

## 2016-09-06 DIAGNOSIS — Z0000 Encounter for general adult medical examination without abnormal findings: Secondary | ICD-10-CM | POA: Diagnosis not present

## 2016-09-06 DIAGNOSIS — Z8739 Personal history of other diseases of the musculoskeletal system and connective tissue: Secondary | ICD-10-CM

## 2016-09-06 DIAGNOSIS — Z8659 Personal history of other mental and behavioral disorders: Secondary | ICD-10-CM

## 2016-09-06 DIAGNOSIS — Z6836 Body mass index (BMI) 36.0-36.9, adult: Secondary | ICD-10-CM | POA: Diagnosis not present

## 2016-09-06 DIAGNOSIS — E7849 Other hyperlipidemia: Secondary | ICD-10-CM

## 2016-09-06 DIAGNOSIS — Z23 Encounter for immunization: Secondary | ICD-10-CM

## 2016-09-06 DIAGNOSIS — I10 Essential (primary) hypertension: Secondary | ICD-10-CM | POA: Diagnosis not present

## 2016-09-06 LAB — POCT URINALYSIS DIPSTICK
Bilirubin, UA: NEGATIVE
Glucose, UA: NEGATIVE
KETONES UA: NEGATIVE
LEUKOCYTES UA: NEGATIVE
NITRITE UA: NEGATIVE
PH UA: 7 (ref 5.0–8.0)
PROTEIN UA: NEGATIVE
RBC UA: NEGATIVE
Spec Grav, UA: <=1.005 — AB (ref 1.010–1.025)
Urobilinogen, UA: 0.2 E.U./dL

## 2016-09-06 NOTE — Progress Notes (Signed)
Subjective:    Patient ID: Jimmy [name], male    DOB: [birth year], 76 y.o.   MRN: [id number]  HPI  76 year old  White Male for health maintenance exam and evaluation of Hypertension and hyperlipidemia. History of depression well controlled on current regimen. See screening for depression.  He has a history of gout but is stable on allopurinol.  He had colonoscopy February 2014 which was normal. He had an exercise tolerance test by Dr. Eustace Quail in 1998 which was negative. He has a strong family history of coronary disease in his father.  Zostavax vaccine August 2010, Pneumovax 23 immunization March 2011, tetanus immunization 2007. Gets annual flu vaccine. Had Prevnar 11/03/2014.  He had Herpes zoster right temple 1998. History of cellulitis right foot 1996.  Social history: He is divorced. Does not smoke. Social alcohol consumption. He has a steady lady friend. He has adult children 3 daughters and a stepson. 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 Father had hypertension, gout, alcohol abuse. Mother had history of stroke and hypertension. She died suddenly of an acute arrest perhaps stroke or MI. One sister died in a motor vehicle accident with history of alcoholism. 2 sisters in good health. 2 brothers, one with history of lymphoma doing well.       Review of Systems  Constitutional: Negative.   Respiratory: Negative.   Cardiovascular: Negative.   Gastrointestinal: Negative.   Genitourinary: Negative.   Neurological: Negative.   Psychiatric/Behavioral: Negative.   All other systems reviewed and are negative.      Objective:   Physical Exam  Constitutional: He is oriented to person, place, and time. He appears well-developed and well-nourished. No distress.    HENT:  Head: Normocephalic and atraumatic.  Right Ear: External ear normal.  Left Ear: External ear normal.  Mouth/Throat: Oropharynx is clear and moist. No oropharyngeal exudate.  Eyes: Conjunctivae and EOM are normal. Pupils are equal, round, and reactive to light. Right eye exhibits no discharge. Left eye exhibits no discharge. No scleral icterus.  Neck: Neck supple. No JVD present. No thyromegaly present.  Cardiovascular: Normal rate, regular rhythm, normal heart sounds and intact distal pulses.   No murmur heard. Pulmonary/Chest: Effort normal and breath sounds normal. No respiratory distress. He has no wheezes. He has no rales.  Abdominal: Soft. Bowel sounds are normal. He exhibits no distension and no mass. There is no tenderness. There is no rebound and no guarding.  Genitourinary: Prostate normal.  Musculoskeletal: He exhibits no edema.  Lymphadenopathy:    He has no cervical adenopathy.  Neurological: He is alert and oriented to person, place, and time. He has normal reflexes. No cranial nerve deficit. Coordination normal.  Skin: Skin is warm and dry. No rash noted. He is not diaphoretic.  Psychiatric: He has a normal mood and affect. His behavior is normal. Judgment and thought content normal.  Vitals reviewed.         Assessment & Plan:  Essential hypertension-stable on current regimen  History of hyperlipidemia-normal on statin therapy  History of depression-patient stable on Prozac and we have elected not to change this medication or discontinue it. He has been on it for many years.  History of gout-no recurrences follow allopurinol  History of erectile dysfunction-currently not sexually active  Plan: Continue same medications and return in 6 months. Recommend that he lose some weight. Tetanus immunization update given today.  Subjective:   Patient presents for Medicare Annual/Subsequent preventive examination.  Review Past Medical/Family/Social:See  above   Risk Factors  Current exercise habits: Walks a fair amount with job Dietary issues discussed: Low fat low carbohydrate  Cardiac risk factors:Family history, hyperlipidemia, hypertension, obesity  Depression Screen  (Note: if answer to either of the following is "Yes", a more complete depression screening is indicated)   Over the past two weeks, have you felt down, depressed or hopeless? No  Over the past two weeks, have you felt little interest or pleasure in doing things? No Have you lost interest or pleasure in daily life? No Do you often feel hopeless? No Do you cry easily over simple problems? No   Activities of Daily Living  In your present state of health, do you have any difficulty performing the following activities?:   Driving? No  Managing money? No  Feeding yourself? No  Getting from bed to chair? No  Climbing a flight of stairs? No  Preparing food and eating?: No  Bathing or showering? No  Getting dressed: No  Getting to the toilet? No  Using the toilet:No  Moving around from place to place: No  In the past year have you fallen or had a near fall?:No  Are you sexually active? No  Do you have more than one partner? No   Hearing Difficulties: No  Do you often ask people to speak up or repeat themselves? No  Do you experience ringing or noises in your ears? Occasionally  Do you have difficulty understanding soft or whispered voices? No  Do you feel that you have a problem with memory? Very slightly Do you often misplace items? Occasionally   Home Safety:  Do you have a smoke alarm at your residence? Yes Do you have grab bars in the bathroom?No Do you have throw rugs in your house? Yes   Cognitive Testing  Alert? Yes Normal Appearance?Yes  Oriented to person? Yes Place? Yes  Time? Yes  Recall of three objects? Yes  Can perform simple calculations? Yes  Displays appropriate judgment?Yes  Can read the correct time from a watch face?Yes   List  the Names of Other Physician/Practitioners you currently use:  See referral list for the physicians patient is currently seeing.     Review of Systems: See above   Objective:     General appearance: Appears younger than stated age and mildly obese  Head: Normocephalic, without obvious abnormality, atraumatic  Eyes: conj clear, EOMi PEERLA  Ears: normal TM's and external ear canals both ears  Nose: Nares normal. Septum midline. Mucosa normal. No drainage or sinus tenderness.  Throat: lips, mucosa, and tongue normal; teeth and gums normal  Neck: no adenopathy, no carotid bruit, no JVD, supple, symmetrical, trachea midline and thyroid not enlarged, symmetric, no tenderness/mass/nodules  No CVA tenderness.  Lungs: clear to auscultation bilaterally  Breasts: normal appearance Heart: regular rate and rhythm, S1, S2 normal, no murmur, click, rub or gallop  Abdomen: soft, non-tender; bowel sounds normal; no masses, no organomegaly  Musculoskeletal: ROM normal in all joints, no crepitus, no deformity, Normal muscle strengthen. Back  is symmetric, no curvature. Skin: Skin color, texture, turgor normal. No rashes or lesions  Lymph nodes: Cervical, supraclavicular, and axillary nodes normal.  Neurologic: CN 2 -12 Normal, Normal symmetric reflexes. Normal coordination and gait  Psych: Alert & Oriented  x 3, Mood appear stable.    Assessment:    Annual wellness medicare exam   Plan:    During the course of the visit the patient was educated and counseled about appropriate screening and preventive services including:   Annual flu vaccine Tetanus immunization update given  Annual PSA     Patient Instructions (the written plan) was given to the patient.  Medicare Attestation  I have personally reviewed:  The patient's medical and social history  Their use of alcohol, tobacco or illicit drugs  Their current medications and supplements  The patient's functional ability including  ADLs,fall risks, home safety risks, cognitive, and hearing and visual impairment  Diet and physical activities  Evidence for depression or mood disorders  The patient's weight, height, BMI, and visual acuity have been recorded in the chart. I have made referrals, counseling, and provided education to the patient based on review of the above and I have provided the patient with a written personalized care plan for preventive services.

## 2016-09-07 ENCOUNTER — Encounter: Payer: Medicare Other | Admitting: Internal Medicine

## 2016-09-12 ENCOUNTER — Telehealth: Payer: Self-pay

## 2016-09-12 MED ORDER — SIMVASTATIN 20 MG PO TABS: 20.0000 mg | ORAL_TABLET | Freq: Every day | ORAL | 1 refills | Status: DC

## 2016-09-12 MED ORDER — SIMVASTATIN 40 MG PO TABS: 40.0000 mg | ORAL_TABLET | Freq: Every day | ORAL | 1 refills | Status: DC

## 2016-09-12 MED ORDER — TRANDOLAPRIL 2 MG PO TABS: 2.0000 mg | ORAL_TABLET | Freq: Every day | ORAL | 1 refills | Status: DC

## 2016-09-12 MED ORDER — TRANDOLAPRIL 1 MG PO TABS: 1.0000 mg | ORAL_TABLET | Freq: Every day | ORAL | 1 refills | Status: DC

## 2016-09-12 NOTE — Telephone Encounter (Signed)
Patient is aware of the changed with medication

## 2016-09-12 NOTE — Telephone Encounter (Signed)
We used to do this but Dr. Maxwell Caul from [facility] says not to split pills anymore. So please correct as Vicente Males requested and call pt to explain. Medicare does not want pt's splitting pills anymore.

## 2016-09-12 NOTE — Telephone Encounter (Signed)
Jimmy [name] from [facility] called and stated that the patient told her he was advised to cut his simvastatin 40 mg and trandolapril 2 mg in half for cost purposes and she would like to know if that was accurate. Also she would like a 90 day supply sent to the pharmacy for his correct dosing and/or instructions for the medications. Her direct line is [phone]. Please advise

## 2016-09-12 NOTE — Telephone Encounter (Signed)
Called and spoke with Jimmy [name] and sent in RX for 90 days as requested with half the mg since pt was cutting in half. Called pharmacy to have them cancel the previous RX for the medications. Called patient and explained to him his dosage has changed and he is no longer to cut them in half. Patient did not answer so LMTCB

## 2016-09-17 ENCOUNTER — Other Ambulatory Visit: Payer: Self-pay | Admitting: Internal Medicine

## 2016-09-18 NOTE — Patient Instructions (Signed)
It was a pleasure to see you today. Continue same medications and return in 6 months. Tetanus immunization update given.

## 2016-09-27 ENCOUNTER — Encounter: Payer: Self-pay | Admitting: Internal Medicine

## 2016-09-27 ENCOUNTER — Ambulatory Visit (INDEPENDENT_AMBULATORY_CARE_PROVIDER_SITE_OTHER): Payer: Medicare Other | Admitting: Internal Medicine

## 2016-09-27 VITALS — BP 122/80 | HR 78 | Temp 99.4°F | Wt 239.0 lb

## 2016-09-27 DIAGNOSIS — I10 Essential (primary) hypertension: Secondary | ICD-10-CM | POA: Diagnosis not present

## 2016-09-27 DIAGNOSIS — J22 Unspecified acute lower respiratory infection: Secondary | ICD-10-CM

## 2016-09-27 MED ORDER — LEVOFLOXACIN 500 MG PO TABS: 500.0000 mg | ORAL_TABLET | Freq: Every day | ORAL | 0 refills | Status: DC

## 2016-09-27 MED ORDER — HYDROCODONE-HOMATROPINE 5-1.5 MG/5ML PO SYRP: 5.0000 mL | ORAL_SOLUTION | Freq: Three times a day (TID) | ORAL | 0 refills | Status: DC | PRN

## 2016-09-27 NOTE — Progress Notes (Signed)
   Subjective:    Patient ID: Jimmy [name], male    DOB: Aug 25, 1940, 76 y.o.   MRN: [id number]  HPI  76 year old Male has come down with upper respiratory infection symptoms. Some malaise and fatigue. Cough and congestion but no documented fever. No shaking chills. No history of smoking. Some sputum production but not a lot.    Review of Systems see above     Objective:   Physical Exam Skin warm and dry. Nodes none. Neck is supple. Chest clear to auscultation without rales or wheezing. TMs and pharynx are clear.       Assessment & Plan:  Acute bronchitis  Plan: Hycodan 1 teaspoon by mouth every 8 hours when necessary cough. Levaquin 500 milligrams daily for 10 days. Rest and drink plenty of fluids.

## 2016-10-03 NOTE — Patient Instructions (Signed)
Levaquin 500 milligrams daily for 10 days. Hycodan 1 teaspoon by mouth every 8 hours when necessary cough. Rest and drink plenty of fluids.

## 2017-01-17 ENCOUNTER — Ambulatory Visit (INDEPENDENT_AMBULATORY_CARE_PROVIDER_SITE_OTHER): Payer: Medicare Other | Admitting: Internal Medicine

## 2017-01-17 DIAGNOSIS — Z23 Encounter for immunization: Secondary | ICD-10-CM | POA: Diagnosis not present

## 2017-01-17 NOTE — Progress Notes (Signed)
Flu vaccine given. Pt. Tolerated well

## 2017-01-17 NOTE — Patient Instructions (Signed)
Flu vaccine given.

## 2017-03-11 ENCOUNTER — Ambulatory Visit: Payer: Medicare Other | Admitting: Internal Medicine

## 2017-03-18 ENCOUNTER — Ambulatory Visit (INDEPENDENT_AMBULATORY_CARE_PROVIDER_SITE_OTHER): Payer: Medicare Other | Admitting: Internal Medicine

## 2017-03-18 ENCOUNTER — Encounter: Payer: Self-pay | Admitting: Internal Medicine

## 2017-03-18 VITALS — BP 130/70 | HR 78 | Temp 98.1°F | Ht 67.5 in | Wt 234.0 lb

## 2017-03-18 DIAGNOSIS — E7849 Other hyperlipidemia: Secondary | ICD-10-CM

## 2017-03-18 DIAGNOSIS — Z8659 Personal history of other mental and behavioral disorders: Secondary | ICD-10-CM | POA: Diagnosis not present

## 2017-03-18 DIAGNOSIS — Z8739 Personal history of other diseases of the musculoskeletal system and connective tissue: Secondary | ICD-10-CM

## 2017-03-18 DIAGNOSIS — I10 Essential (primary) hypertension: Secondary | ICD-10-CM

## 2017-03-18 NOTE — Patient Instructions (Signed)
It was a pleasure to see you today.  Continue same medications and return in 6 months for physical exam.  Immunizations are up-to-date.

## 2017-03-18 NOTE — Progress Notes (Signed)
   Subjective:    Patient ID: Jimmy [name], male    DOB: 16-Sep-1940, 76 y.o.   MRN: [id number]  HPI Here today for six-month follow-up on hypertension.  Blood pressure is excellent today on current regimen.  He has situational stress with long-term lady friend who has been diagnosed with lung cancer and has heart failure from mitral insufficiency.  They are hoping to do a heart procedure to help with her recurrent heart failure.  She has not been able to start any treatment for lung cancer yet due to heart issues.   Review of Systems he has no new complaints     Objective:   Physical Exam Skin warm and dry.  Nodes none.  Neck is supple without JVD thyromegaly or carotid bruits.  Chest clear to auscultation.  Cardiac exam regular rate and rhythm normal S1 and S2.  Extremities without edema.       Assessment & Plan:  Essential hypertension well-controlled  We reviewed his immunizations today and they are up-to-date.  Hyperlipidemia treated with statin therapy lipid panel not checked in conjunction with this visit  History of gout treated with as needed Indocin.  He has a history of depression maintained on SSRI and is stable.  Handling this new stress well.

## 2017-03-30 ENCOUNTER — Other Ambulatory Visit: Payer: Self-pay | Admitting: Internal Medicine

## 2017-04-19 DIAGNOSIS — H2513 Age-related nuclear cataract, bilateral: Secondary | ICD-10-CM | POA: Diagnosis not present

## 2017-05-17 ENCOUNTER — Encounter: Payer: Self-pay | Admitting: Podiatry

## 2017-05-17 ENCOUNTER — Ambulatory Visit: Payer: Medicare Other | Admitting: Podiatry

## 2017-05-17 ENCOUNTER — Ambulatory Visit (INDEPENDENT_AMBULATORY_CARE_PROVIDER_SITE_OTHER): Payer: Medicare Other

## 2017-05-17 DIAGNOSIS — L84 Corns and callosities: Secondary | ICD-10-CM

## 2017-05-17 DIAGNOSIS — M2041 Other hammer toe(s) (acquired), right foot: Secondary | ICD-10-CM

## 2017-05-17 NOTE — Progress Notes (Signed)
Subjective:   Patient ID: Jimmy [name], male   DOB: 77 y.o.   MRN: [id number]   HPI Patient presents with pain in the fifth digit right with keratotic lesion that is painful when pressed.  Patient at this point states that he is tried wider shoes and has had previous trimming   ROS      Objective:  Physical Exam  Significant increase in keratotic formation digit 5 right with lesion formation and pain with palpation     Assessment:  Hammertoe deformity with rotated fifth digit right with keratotic lesion formation     Plan:  H&P condition reviewed hammertoe discussed with possibility for arthroplasty.  Reviewed x-ray and today's chart deep debridement accomplished and sterile dressing applied and gave instructions on soaks.  Reappoint when symptomatic again we will decide what might be necessary for this  X-rays indicate there is rotation fifth digit right with enlargement had a proximal phalanx fifth toe

## 2017-05-20 ENCOUNTER — Ambulatory Visit: Payer: Medicare Other | Admitting: Podiatry

## 2017-06-18 ENCOUNTER — Telehealth: Payer: Self-pay | Admitting: Internal Medicine

## 2017-06-18 ENCOUNTER — Encounter: Payer: Self-pay | Admitting: Internal Medicine

## 2017-06-18 NOTE — Telephone Encounter (Signed)
Celesta Gentile found deceased at home by patient. Call in Xanax 1 mg one po bid prn anxiety to pharmacy #[phone]

## 2017-06-26 ENCOUNTER — Other Ambulatory Visit: Payer: Self-pay | Admitting: Internal Medicine

## 2017-08-22 ENCOUNTER — Other Ambulatory Visit: Payer: Self-pay

## 2017-08-22 DIAGNOSIS — I10 Essential (primary) hypertension: Secondary | ICD-10-CM

## 2017-08-22 DIAGNOSIS — Z0000 Encounter for general adult medical examination without abnormal findings: Secondary | ICD-10-CM

## 2017-08-22 DIAGNOSIS — Z125 Encounter for screening for malignant neoplasm of prostate: Secondary | ICD-10-CM

## 2017-09-09 ENCOUNTER — Other Ambulatory Visit: Payer: Medicare Other | Admitting: Internal Medicine

## 2017-09-09 DIAGNOSIS — Z0000 Encounter for general adult medical examination without abnormal findings: Secondary | ICD-10-CM | POA: Diagnosis not present

## 2017-09-09 DIAGNOSIS — Z125 Encounter for screening for malignant neoplasm of prostate: Secondary | ICD-10-CM

## 2017-09-09 DIAGNOSIS — I10 Essential (primary) hypertension: Secondary | ICD-10-CM | POA: Diagnosis not present

## 2017-09-09 DIAGNOSIS — E785 Hyperlipidemia, unspecified: Secondary | ICD-10-CM | POA: Diagnosis not present

## 2017-09-10 ENCOUNTER — Ambulatory Visit (INDEPENDENT_AMBULATORY_CARE_PROVIDER_SITE_OTHER): Payer: Medicare Other | Admitting: Internal Medicine

## 2017-09-10 ENCOUNTER — Encounter: Payer: Self-pay | Admitting: Internal Medicine

## 2017-09-10 VITALS — BP 120/72 | HR 68 | Temp 98.2°F | Ht 67.5 in | Wt 230.0 lb

## 2017-09-10 DIAGNOSIS — E7849 Other hyperlipidemia: Secondary | ICD-10-CM | POA: Diagnosis not present

## 2017-09-10 DIAGNOSIS — I10 Essential (primary) hypertension: Secondary | ICD-10-CM

## 2017-09-10 DIAGNOSIS — F4321 Adjustment disorder with depressed mood: Secondary | ICD-10-CM

## 2017-09-10 DIAGNOSIS — Z0000 Encounter for general adult medical examination without abnormal findings: Secondary | ICD-10-CM | POA: Diagnosis not present

## 2017-09-10 DIAGNOSIS — Z8739 Personal history of other diseases of the musculoskeletal system and connective tissue: Secondary | ICD-10-CM

## 2017-09-10 DIAGNOSIS — Z8659 Personal history of other mental and behavioral disorders: Secondary | ICD-10-CM | POA: Diagnosis not present

## 2017-09-10 LAB — PSA: PSA: 1 ng/mL (ref <=4.0)

## 2017-09-10 LAB — CBC WITH DIFFERENTIAL/PLATELET
Basophils Absolute: 41 cells/uL (ref 0–200)
Basophils Relative: 0.8 %
EOS PCT: 1.2 %
Eosinophils Absolute: 61 cells/uL (ref 15–500)
HCT: 43.7 % (ref 38.5–50.0)
HEMOGLOBIN: 15.3 g/dL (ref 13.2–17.1)
Lymphs Abs: 1214 cells/uL (ref 850–3900)
MCH: 32.6 pg (ref 27.0–33.0)
MCHC: 35 g/dL (ref 32.0–36.0)
MCV: 93 fL (ref 80.0–100.0)
MONOS PCT: 10.7 %
MPV: 10.4 fL (ref 7.5–12.5)
NEUTROS ABS: 3239 {cells}/uL (ref 1500–7800)
Neutrophils Relative %: 63.5 %
Platelets: 155 10*3/uL (ref 140–400)
RBC: 4.7 10*6/uL (ref 4.20–5.80)
RDW: 13.1 % (ref 11.0–15.0)
Total Lymphocyte: 23.8 %
WBC mixed population: 546 cells/uL (ref 200–950)
WBC: 5.1 10*3/uL (ref 3.8–10.8)

## 2017-09-10 LAB — COMPLETE METABOLIC PANEL WITH GFR
AG Ratio: 1.8 (calc) (ref 1.0–2.5)
ALT: 18 U/L (ref 9–46)
AST: 22 U/L (ref 10–35)
Albumin: 4.4 g/dL (ref 3.6–5.1)
Alkaline phosphatase (APISO): 66 U/L (ref 40–115)
BILIRUBIN TOTAL: 0.9 mg/dL (ref 0.2–1.2)
BUN: 16 mg/dL (ref 7–25)
CHLORIDE: 104 mmol/L (ref 98–110)
CO2: 28 mmol/L (ref 20–32)
Calcium: 9.4 mg/dL (ref 8.6–10.3)
Creat: 0.91 mg/dL (ref 0.70–1.18)
GFR, Est African American: 95 mL/min/{1.73_m2} (ref >=60)
GFR, Est Non African American: 82 mL/min/{1.73_m2} (ref >=60)
GLOBULIN: 2.5 g/dL (ref 1.9–3.7)
Glucose, Bld: 97 mg/dL (ref 65–99)
POTASSIUM: 4.8 mmol/L (ref 3.5–5.3)
Sodium: 139 mmol/L (ref 135–146)
Total Protein: 6.9 g/dL (ref 6.1–8.1)

## 2017-09-10 LAB — LIPID PANEL
CHOL/HDL RATIO: 2.6 (calc) (ref <5.0)
CHOLESTEROL: 156 mg/dL (ref <200)
HDL: 59 mg/dL (ref >40)
LDL CHOLESTEROL (CALC): 80 mg/dL
Non-HDL Cholesterol (Calc): 97 mg/dL (calc) (ref <130)
TRIGLYCERIDES: 91 mg/dL (ref <150)

## 2017-09-10 LAB — POCT URINALYSIS DIPSTICK
Appearance: NORMAL
Bilirubin, UA: NEGATIVE
Blood, UA: NEGATIVE
GLUCOSE UA: NEGATIVE
KETONES UA: NEGATIVE
Leukocytes, UA: NEGATIVE
Nitrite, UA: NEGATIVE
ODOR: NORMAL
Protein, UA: NEGATIVE
Spec Grav, UA: 1.01 (ref 1.010–1.025)
Urobilinogen, UA: 0.2 E.U./dL
pH, UA: 6.5 (ref 5.0–8.0)

## 2017-09-10 LAB — TEST AUTHORIZATION

## 2017-09-18 NOTE — Progress Notes (Signed)
Subjective:    Patient ID: Jimmy [name], male    DOB: [birth year], 77 y.o.   MRN: [id number]  HPI 77 year old White Male in today for health maintenance exam and evaluation of medical issues.  Several weeks ago, he lost his long-term partner, Eber Hong.  She had lung cancer and a heart condition.  He came home from work to find her deceased.  He has been very disturbed about this.  He continues to work.  She was to have undergone a mitral clip procedure at [facility] but she died just a few weeks before the procedure was to be done.  He now is feeling quite lonely.  They had good times together.  They like to do some of the same things such as traveling to the mountains for the weekend.  He talked extensively about this today.  He is sleeping better but wonders if he could have done something differently to avoid her death at home.  I assured him that he had done everything he could for her.  He is on Prozac 10 mg daily.  We talked about grief counseling but he prefers just to come back here in a few weeks to talk some more.  He has a history of hypertension, history of gout, history of depression, history of hyperlipidemia.  He had colonoscopy February 2014 which was normal.  He had an exercise tolerance test by Dr. Rockne Menghini in 1998 which was negative.  He has a strong family history of coronary disease in his father.  He had Herpes zoster right temple 1998.  Cellulitis right foot 1996.  Gets annual flu vaccine.  He has had Pneumovax 23 and Prevnar.  Zostavax vaccine August 2010.  Discussed Shingrix vaccine.  Had tetanus immunization 2007.  Social history he is divorced.  Does not smoke.  Social alcohol consumption.  He has adult children-3 daughters and a stepson.  He is Recruitment consultant.  Family history: Father with history of coronary artery disease status post CABG diabetes heart problems.  Father had hypertension, gout, alcohol abuse.  Mother had history of stroke and  hypertension.  She died suddenly of an acute arrest perhaps stroke or MI.  One sister died in a motor vehicle accident with history of alcoholism.  2 sisters in good health.  2 brothers- 1 of whom has history lymphoma but doing well.      Review of Systems grief, anxiety, malaise     Objective:   Physical Exam  Constitutional: He is oriented to person, place, and time. He appears well-developed and well-nourished.  HENT:  Head: Normocephalic and atraumatic.  Right Ear: External ear normal.  Left Ear: External ear normal.  Mouth/Throat: Oropharynx is clear and moist.  Eyes: Pupils are equal, round, and reactive to light. Right eye exhibits no discharge. Left eye exhibits no discharge.  Neck: Neck supple. No JVD present. No thyromegaly present.  Cardiovascular: Normal heart sounds and intact distal pulses. Exam reveals no gallop and no friction rub.  No murmur heard. Pulmonary/Chest: Effort normal and breath sounds normal. No stridor. No respiratory distress. He has no wheezes. He has no rales.  Abdominal: Soft. Bowel sounds are normal. He exhibits no distension and no mass. There is no tenderness. There is no rebound and no guarding. No hernia.  Musculoskeletal: He exhibits no edema.  Lymphadenopathy:    He has no cervical adenopathy.  Neurological: He is alert and oriented to person, place, and time. He displays normal reflexes.  No cranial nerve deficit or sensory deficit. He exhibits normal muscle tone. Coordination normal.  Skin: Skin is warm and dry.  Psychiatric: His behavior is normal. Judgment and thought content normal.  Grief reaction appears to be normal          Assessment & Plan:  Grief reaction  Essential hypertension  Hyperlipidemia  History of gout  History of depression  Plan: We will follow-up in 1 month and see how he is doing.  Lab work reviewed with him is entirely within normal limits.  Subjective:   Patient presents for Medicare Annual/Subsequent  preventive examination.  Review Past Medical/Family/Social: See above   Risk Factors  Current exercise habits: Walks some Dietary issues discussed: Low-fat low carbohydrate  Cardiac risk factors: Hypertension, family history, hyperlipidemia  Depression Screen  (Note: if answer to either of the following is "Yes", a more complete depression screening is indicated)   Over the past two weeks, have you felt down, depressed or hopeless?  yes due to the death of close friend Over the past two weeks, have you felt little interest or pleasure in doing things?  yes see above Have you lost interest or pleasure in daily life?  he has see above Do you often feel hopeless? No Do you cry easily over simple problems? No   Activities of Daily Living  In your present state of health, do you have any difficulty performing the following activities?:   Driving? No  Managing money? No  Feeding yourself? No  Getting from bed to chair? No  Climbing a flight of stairs? No  Preparing food and eating?: No  Bathing or showering? No  Getting dressed: No  Getting to the toilet? No  Using the toilet:No  Moving around from place to place: No  In the past year have you fallen or had a near fall?:No  Are you sexually active? No  Do you have more than one partner? No   Hearing Difficulties: No  Do you often ask people to speak up or repeat themselves? No  Do you experience ringing or noises in your ears?  Yes Do you have difficulty understanding soft or whispered voices? No  Do you feel that you have a problem with memory? No Do you often misplace items? No    Home Safety:  Do you have a smoke alarm at your residence? Yes Do you have grab bars in the bathroom?  No Do you have throw rugs in your house?  Yes   Cognitive Testing  Alert? Yes Normal Appearance?Yes  Oriented to person? Yes Place? Yes  Time? Yes  Recall of three objects? Yes  Can perform simple calculations? Yes  Displays  appropriate judgment?Yes  Can read the correct time from a watch face?Yes   List the Names of Other Physician/Practitioners you currently use:  See referral list for the physicians patient is currently seeing.     Review of Systems: See above   Objective:     General appearance: Appears stated age and mildly obese  Head: Normocephalic, without obvious abnormality, atraumatic  Eyes: conj clear, EOMi PEERLA  Ears: normal TM's and external ear canals both ears  Nose: Nares normal. Septum midline. Mucosa normal. No drainage or sinus tenderness.  Throat: lips, mucosa, and tongue normal; teeth and gums normal  Neck: no adenopathy, no carotid bruit, no JVD, supple, symmetrical, trachea midline and thyroid not enlarged, symmetric, no tenderness/mass/nodules  No CVA tenderness.  Lungs: clear to auscultation bilaterally  Breasts: normal  appearance Heart: regular rate and rhythm, S1, S2 normal, no murmur, click, rub or gallop  Abdomen: soft, non-tender; bowel sounds normal; no masses, no organomegaly  Musculoskeletal: ROM normal in all joints, no crepitus, no deformity, Normal muscle strengthen. Back  is symmetric, no curvature. Skin: Skin color, texture, turgor normal. No rashes or lesions  Lymph nodes: Cervical, supraclavicular, and axillary nodes normal.  Neurologic: CN 2 -12 Normal, Normal symmetric reflexes. Normal coordination and gait  Psych: Alert & Oriented x 3, Mood appear stable.    Assessment:    Annual wellness medicare exam   Plan:    During the course of the visit the patient was educated and counseled about appropriate screening and preventive services including:   Annual PSA  Annual flu vaccine     Patient Instructions (the written plan) was given to the patient.  Medicare Attestation  I have personally reviewed:  The patient's medical and social history  Their use of alcohol, tobacco or illicit drugs  Their current medications and supplements  The  patient's functional ability including ADLs,fall risks, home safety risks, cognitive, and hearing and visual impairment  Diet and physical activities  Evidence for depression or mood disorders  The patient's weight, height, BMI, and visual acuity have been recorded in the chart. I have made referrals, counseling, and provided education to the patient based on review of the above and I have provided the patient with a written personalized care plan for preventive services.

## 2017-09-18 NOTE — Patient Instructions (Addendum)
Our thoughts are with you now at this time of loss and grief.  Please return in 1 month for follow-up.  Continue same medications.

## 2017-09-20 ENCOUNTER — Other Ambulatory Visit: Payer: Self-pay | Admitting: Internal Medicine

## 2017-09-24 DIAGNOSIS — L570 Actinic keratosis: Secondary | ICD-10-CM | POA: Diagnosis not present

## 2017-09-24 DIAGNOSIS — L821 Other seborrheic keratosis: Secondary | ICD-10-CM | POA: Diagnosis not present

## 2017-09-24 DIAGNOSIS — X32XXXA Exposure to sunlight, initial encounter: Secondary | ICD-10-CM | POA: Diagnosis not present

## 2017-09-24 DIAGNOSIS — D225 Melanocytic nevi of trunk: Secondary | ICD-10-CM | POA: Diagnosis not present

## 2017-10-15 ENCOUNTER — Encounter: Payer: Self-pay | Admitting: Internal Medicine

## 2017-10-15 ENCOUNTER — Ambulatory Visit: Payer: Medicare Other | Admitting: Internal Medicine

## 2017-10-15 VITALS — BP 120/70 | HR 78 | Temp 98.1°F | Ht 67.5 in | Wt 230.0 lb

## 2017-10-15 DIAGNOSIS — F4321 Adjustment disorder with depressed mood: Secondary | ICD-10-CM | POA: Diagnosis not present

## 2017-10-15 NOTE — Progress Notes (Signed)
   Subjective:    Patient ID: Jimmy [name], male    DOB: 09-Nov-1940, 77 y.o.   MRN: [id number]  HPI 77 year old Male in today for follow-up on grief reaction.  He is on Prozac 10 mg daily.  He lost his partner, Anselm Jungling, in late February 2019.  He has been spending time with his brother fishing summer weekends.  They go to a lake in [place].  He went this past weekend and had a good time.  His brother's wife has dementia.  Patient took a trip alone up to [facility]. in [place] and says he did okay emotionally.  He and they like to do that on weekends frequently in the summertime.  He feels more comfortable traveling alone now.  Some improvement in grief.  We talked about increasing his Prozac to 20 mg daily and I think that would be good.  If necessary we can add Wellbutrin.  I like to see him again in 4 weeks but I do think after talking with him today for some 15 minutes that he is better.  I did go over his lipid panel from last visit with him and it is within normal limits.  He saw Dr. Dayton Martes recently and had some actinic keratoses on his face treated with liquid nitrogen.    Review of Systems see above      Objective:   Physical Exam  Not examined-spent 15 minutes speaking with him today about grief reaction      Assessment & Plan:  Grief reaction-slowly improving  Plan: Increase Prozac to 20 mg daily and follow-up in about a month

## 2017-10-15 NOTE — Patient Instructions (Addendum)
Increase Prozac to 20 mg daily and follow-up in about a month.

## 2017-10-22 DIAGNOSIS — X32XXXD Exposure to sunlight, subsequent encounter: Secondary | ICD-10-CM | POA: Diagnosis not present

## 2017-10-22 DIAGNOSIS — L570 Actinic keratosis: Secondary | ICD-10-CM | POA: Diagnosis not present

## 2017-11-11 ENCOUNTER — Ambulatory Visit: Payer: Medicare Other | Admitting: Internal Medicine

## 2017-11-11 VITALS — BP 120/80 | HR 68 | Temp 98.1°F | Ht 67.5 in | Wt 230.0 lb

## 2017-11-11 DIAGNOSIS — E7800 Pure hypercholesterolemia, unspecified: Secondary | ICD-10-CM | POA: Diagnosis not present

## 2017-11-11 DIAGNOSIS — I10 Essential (primary) hypertension: Secondary | ICD-10-CM

## 2017-11-11 DIAGNOSIS — F4321 Adjustment disorder with depressed mood: Secondary | ICD-10-CM | POA: Diagnosis not present

## 2017-11-11 DIAGNOSIS — Z8739 Personal history of other diseases of the musculoskeletal system and connective tissue: Secondary | ICD-10-CM

## 2017-11-11 MED ORDER — FLUOXETINE HCL 20 MG PO TABS: 20.0000 mg | ORAL_TABLET | Freq: Every day | ORAL | 3 refills | Status: DC

## 2017-11-11 NOTE — Progress Notes (Signed)
   Subjective:    Patient ID: Arcola Jansky, male    DOB: [birth year], 77 y.o.   MRN: [id number]  HPI 77 year old Male for follow-up on grief reaction.  At last visit, we increased Prozac which he has taken for many years for depression from 10 to 20 mg daily.  Seems to be feeling a bit better.  Has been to the mountains a couple of weekends by himself to some festivals.  Says he realizes  time will help him adjust.  He misses his long-term partner very much.  He has a history of hyperlipidemia treated with statin medication.  Family history of heart disease in his father.  Long-standing history of essential hypertension.  History of gout but no recent episodes since being on allopurinol for several years.  Review of his old records prior to [facility] indicate he had a history of pure hypercholesterolemia for which statin medication was started.    Review of Systems no new complaints     Objective:   Physical Exam Blood pressure is excellent at 120/70.  Spent 15 minutes speaking with him about grief reaction and how he is doing in general at the present time.       Assessment & Plan:  Essential hypertension-stable on current regimen  Grief reaction-improved on increased dose of Prozac  History of gout-no recurrence since being on allopurinol  Hyperlipidemia-treated with statin medication  Plan: He will return in November for 37-month follow-up with lipid panel and liver functions drawn prior to visit.

## 2017-11-12 ENCOUNTER — Encounter: Payer: Self-pay | Admitting: Internal Medicine

## 2017-11-12 NOTE — Patient Instructions (Signed)
It was a pleasure to see you today.  Continue Prozac 20 mg daily and other medications as previously prescribed.  Return for six-month follow-up in November

## 2017-12-15 ENCOUNTER — Other Ambulatory Visit: Payer: Self-pay | Admitting: Internal Medicine

## 2017-12-17 ENCOUNTER — Other Ambulatory Visit: Payer: Self-pay | Admitting: Internal Medicine

## 2018-01-01 ENCOUNTER — Ambulatory Visit (INDEPENDENT_AMBULATORY_CARE_PROVIDER_SITE_OTHER): Payer: Medicare Other | Admitting: Internal Medicine

## 2018-01-01 DIAGNOSIS — Z23 Encounter for immunization: Secondary | ICD-10-CM

## 2018-01-01 NOTE — Patient Instructions (Signed)
Patient received a flu shot IM left deltoid.

## 2018-01-01 NOTE — Progress Notes (Signed)
Flu vaccine given by CMA 

## 2018-01-02 ENCOUNTER — Ambulatory Visit: Payer: Medicare Other | Admitting: Podiatry

## 2018-01-02 ENCOUNTER — Encounter: Payer: Self-pay | Admitting: Podiatry

## 2018-01-02 DIAGNOSIS — M2041 Other hammer toe(s) (acquired), right foot: Secondary | ICD-10-CM | POA: Diagnosis not present

## 2018-01-02 DIAGNOSIS — L84 Corns and callosities: Secondary | ICD-10-CM

## 2018-01-04 NOTE — Progress Notes (Signed)
Subjective:   Patient ID: Jimmy [name], male   DOB: 77 y.o.   MRN: [id number]   HPI Patient presents again with painful fifth digit right with distal keratotic lesion that makes it hard to wear shoe gear with   ROS      Objective:  Physical Exam  Neurovascular status intact with distal keratotic lesion digit 5 right is very painful when palpated making shoe gear difficult with rotated fifth digit noted     Assessment:  Hammertoe deformity fifth digit right with distal keratotic lesion is very painful     Plan:  Reviewed condition and recommended debridement padding with consideration for hammertoe arthroplasty but I educated patient on today.  Today using sharp sterile instrumentation I debrided the lesion with no iatrogenic bleeding noted

## 2018-03-10 ENCOUNTER — Other Ambulatory Visit: Payer: Self-pay | Admitting: Internal Medicine

## 2018-03-10 DIAGNOSIS — Z5181 Encounter for therapeutic drug level monitoring: Secondary | ICD-10-CM

## 2018-03-10 DIAGNOSIS — Z79899 Other long term (current) drug therapy: Secondary | ICD-10-CM

## 2018-03-10 DIAGNOSIS — E7849 Other hyperlipidemia: Secondary | ICD-10-CM

## 2018-03-11 ENCOUNTER — Other Ambulatory Visit: Payer: Medicare Other | Admitting: Internal Medicine

## 2018-03-11 DIAGNOSIS — E7849 Other hyperlipidemia: Secondary | ICD-10-CM | POA: Diagnosis not present

## 2018-03-11 DIAGNOSIS — Z5181 Encounter for therapeutic drug level monitoring: Secondary | ICD-10-CM

## 2018-03-11 DIAGNOSIS — Z79899 Other long term (current) drug therapy: Secondary | ICD-10-CM | POA: Diagnosis not present

## 2018-03-11 LAB — HEPATIC FUNCTION PANEL
AG Ratio: 2.1 (calc) (ref 1.0–2.5)
ALBUMIN MSPROF: 4.5 g/dL (ref 3.6–5.1)
ALT: 20 U/L (ref 9–46)
AST: 21 U/L (ref 10–35)
Alkaline phosphatase (APISO): 70 U/L (ref 40–115)
BILIRUBIN DIRECT: 0.1 mg/dL (ref 0.0–0.2)
BILIRUBIN INDIRECT: 0.5 mg/dL (ref 0.2–1.2)
Globulin: 2.1 g/dL (calc) (ref 1.9–3.7)
Total Bilirubin: 0.6 mg/dL (ref 0.2–1.2)
Total Protein: 6.6 g/dL (ref 6.1–8.1)

## 2018-03-11 LAB — LIPID PANEL
CHOL/HDL RATIO: 2.4 (calc) (ref <5.0)
CHOLESTEROL: 139 mg/dL (ref <200)
HDL: 59 mg/dL (ref >40)
LDL Cholesterol (Calc): 66 mg/dL (calc)
Non-HDL Cholesterol (Calc): 80 mg/dL (calc) (ref <130)
TRIGLYCERIDES: 56 mg/dL (ref <150)

## 2018-03-13 ENCOUNTER — Ambulatory Visit: Payer: Medicare Other | Admitting: Internal Medicine

## 2018-03-13 ENCOUNTER — Encounter: Payer: Self-pay | Admitting: Internal Medicine

## 2018-03-13 VITALS — BP 140/80 | HR 74 | Ht 67.5 in | Wt 217.0 lb

## 2018-03-13 DIAGNOSIS — Z6833 Body mass index (BMI) 33.0-33.9, adult: Secondary | ICD-10-CM | POA: Diagnosis not present

## 2018-03-13 DIAGNOSIS — Z8659 Personal history of other mental and behavioral disorders: Secondary | ICD-10-CM | POA: Diagnosis not present

## 2018-03-13 DIAGNOSIS — I10 Essential (primary) hypertension: Secondary | ICD-10-CM

## 2018-03-13 DIAGNOSIS — E7800 Pure hypercholesterolemia, unspecified: Secondary | ICD-10-CM

## 2018-03-13 DIAGNOSIS — F4321 Adjustment disorder with depressed mood: Secondary | ICD-10-CM

## 2018-03-13 NOTE — Patient Instructions (Addendum)
Shingrix vaccine order given.  Continue to monitor blood pressure at home.  Continue diet and exercise regimen.  Follow-up in 6 months.  Continue same medications.  It was a pleasure to see you today.

## 2018-03-13 NOTE — Progress Notes (Signed)
   Subjective:    Patient ID: Jimmy [name], male    DOB: July 29, 1940, 77 y.o.   MRN: [id number]  HPI 77 year old Male in today for 12-month recheck.  He is on statin medication and lipid panel is completely normal as are his liver functions.  Blood pressure slightly elevated on arrival at 140/80 but was rechecked and was 120/80.  He will continue to monitor at home and let me know if it is persistently elevated above 389 systolically or above 84 diastolically.  His BMI is 33.49 and he weighs 217 pounds.  He has lost a little bit of weight.  Weight 234 pounds November 2018.  This is been a rough year and that he lost his significant other, Jimmy [name].  He seems to be doing fairly well and will be spending Thanksgiving with Faye's son and sister.    Review of Systems no new complaints     Objective:   Physical Exam Skin warm and dry.  Nodes none.  Neck is supple without JVD thyromegaly or carotid bruits.  Chest is clear to auscultation.  Cardiac exam regular rate and rhythm.  Extremities without edema.       Assessment & Plan:  Essential hypertension-continue to monitor at home and let me know if persistently elevated.  Repeat reading today is within normal limits  Hyperlipidemia-stable on statin medication  Grief reaction-doing better and remains on SSRI  Order given for Shingrix vaccine    Plan: He will return in 6 months for physical examination which will be late May 2020.  25 minutes spent with patient over 50% of this time was spent with direct discussion regarding grief as well as reviewing lipid results and rechecking blood pressure with discussion of blood pressure management.

## 2018-03-15 ENCOUNTER — Other Ambulatory Visit: Payer: Self-pay | Admitting: Internal Medicine

## 2018-04-29 DIAGNOSIS — X32XXXD Exposure to sunlight, subsequent encounter: Secondary | ICD-10-CM | POA: Diagnosis not present

## 2018-04-29 DIAGNOSIS — L570 Actinic keratosis: Secondary | ICD-10-CM | POA: Diagnosis not present

## 2018-05-07 ENCOUNTER — Ambulatory Visit (INDEPENDENT_AMBULATORY_CARE_PROVIDER_SITE_OTHER): Payer: Medicare Other | Admitting: Internal Medicine

## 2018-05-07 ENCOUNTER — Other Ambulatory Visit: Payer: Self-pay

## 2018-05-07 VITALS — BP 140/80 | HR 78

## 2018-05-07 DIAGNOSIS — I10 Essential (primary) hypertension: Secondary | ICD-10-CM | POA: Diagnosis not present

## 2018-05-17 ENCOUNTER — Encounter: Payer: Self-pay | Admitting: Internal Medicine

## 2018-05-17 NOTE — Patient Instructions (Signed)
Please get another blood pressure monitor from pharmacy and return and let us check it against our blood pressure cuff in a couple of weeks

## 2018-05-17 NOTE — Progress Notes (Signed)
   Subjective:    Patient ID: Jimmy [name], male    DOB: August 11, 1940, 78 y.o.   MRN: [id number]  HPI He has brought in a home blood pressure cuff that he purchased recently but it is running high compared to what we are getting with our cuff here.  It is even running high with me today.  I think he needs to take this device back and get another one.  He brings in multiple blood pressure readings from home a little on the high side 140-150/100.    Review of Systems     Objective:   Physical Exam Not examined but looked over blood pressure readings he brought from home and measured his blood pressure with home device he purchased here several times getting various readings they are not consistent       Assessment & Plan:  Essential hypertension  Elevated blood pressure  Probable malfunctioning home blood pressure monitor  Plan: He will get another blood pressure monitor and return and let us check it against our blood pressure cuff

## 2018-06-09 ENCOUNTER — Other Ambulatory Visit: Payer: Self-pay | Admitting: Internal Medicine

## 2018-06-17 DIAGNOSIS — H2513 Age-related nuclear cataract, bilateral: Secondary | ICD-10-CM | POA: Diagnosis not present

## 2018-09-09 ENCOUNTER — Other Ambulatory Visit: Payer: Medicare Other | Admitting: Internal Medicine

## 2018-09-09 ENCOUNTER — Other Ambulatory Visit: Payer: Self-pay

## 2018-09-09 DIAGNOSIS — E7800 Pure hypercholesterolemia, unspecified: Secondary | ICD-10-CM

## 2018-09-09 DIAGNOSIS — Z125 Encounter for screening for malignant neoplasm of prostate: Secondary | ICD-10-CM

## 2018-09-09 DIAGNOSIS — I10 Essential (primary) hypertension: Secondary | ICD-10-CM | POA: Diagnosis not present

## 2018-09-09 DIAGNOSIS — Z8659 Personal history of other mental and behavioral disorders: Secondary | ICD-10-CM

## 2018-09-10 LAB — COMPLETE METABOLIC PANEL WITH GFR
AG Ratio: 2 (calc) (ref 1.0–2.5)
ALT: 21 U/L (ref 9–46)
AST: 21 U/L (ref 10–35)
Albumin: 4.3 g/dL (ref 3.6–5.1)
Alkaline phosphatase (APISO): 62 U/L (ref 35–144)
BUN: 18 mg/dL (ref 7–25)
CO2: 28 mmol/L (ref 20–32)
Calcium: 9 mg/dL (ref 8.6–10.3)
Chloride: 102 mmol/L (ref 98–110)
Creat: 0.98 mg/dL (ref 0.70–1.18)
GFR, Est African American: 86 mL/min/{1.73_m2} (ref >=60)
GFR, Est Non African American: 75 mL/min/{1.73_m2} (ref >=60)
Globulin: 2.1 g/dL (calc) (ref 1.9–3.7)
Glucose, Bld: 104 mg/dL — ABNORMAL HIGH (ref 65–99)
Potassium: 5 mmol/L (ref 3.5–5.3)
Sodium: 137 mmol/L (ref 135–146)
Total Bilirubin: 0.6 mg/dL (ref 0.2–1.2)
Total Protein: 6.4 g/dL (ref 6.1–8.1)

## 2018-09-10 LAB — CBC WITH DIFFERENTIAL/PLATELET
Absolute Monocytes: 480 cells/uL (ref 200–950)
Basophils Absolute: 40 cells/uL (ref 0–200)
Basophils Relative: 0.9 %
Eosinophils Absolute: 110 cells/uL (ref 15–500)
Eosinophils Relative: 2.5 %
HCT: 43.7 % (ref 38.5–50.0)
Hemoglobin: 15 g/dL (ref 13.2–17.1)
Lymphs Abs: 1232 cells/uL (ref 850–3900)
MCH: 32.6 pg (ref 27.0–33.0)
MCHC: 34.3 g/dL (ref 32.0–36.0)
MCV: 95 fL (ref 80.0–100.0)
MPV: 10.5 fL (ref 7.5–12.5)
Monocytes Relative: 10.9 %
Neutro Abs: 2539 cells/uL (ref 1500–7800)
Neutrophils Relative %: 57.7 %
Platelets: 172 10*3/uL (ref 140–400)
RBC: 4.6 10*6/uL (ref 4.20–5.80)
RDW: 13.1 % (ref 11.0–15.0)
Total Lymphocyte: 28 %
WBC: 4.4 10*3/uL (ref 3.8–10.8)

## 2018-09-10 LAB — LIPID PANEL
Cholesterol: 154 mg/dL (ref <200)
HDL: 58 mg/dL (ref >=40)
LDL Cholesterol (Calc): 81 mg/dL (calc)
Non-HDL Cholesterol (Calc): 96 mg/dL (calc) (ref <130)
Total CHOL/HDL Ratio: 2.7 (calc) (ref <5.0)
Triglycerides: 72 mg/dL (ref <150)

## 2018-09-10 LAB — PSA: PSA: 0.4 ng/mL (ref <=4.0)

## 2018-09-18 ENCOUNTER — Ambulatory Visit (INDEPENDENT_AMBULATORY_CARE_PROVIDER_SITE_OTHER): Payer: Medicare Other | Admitting: Internal Medicine

## 2018-09-18 ENCOUNTER — Encounter: Payer: Self-pay | Admitting: Internal Medicine

## 2018-09-18 ENCOUNTER — Other Ambulatory Visit: Payer: Self-pay

## 2018-09-18 VITALS — BP 150/80 | HR 80 | Ht 67.5 in | Wt 212.0 lb

## 2018-09-18 DIAGNOSIS — E7800 Pure hypercholesterolemia, unspecified: Secondary | ICD-10-CM

## 2018-09-18 DIAGNOSIS — I10 Essential (primary) hypertension: Secondary | ICD-10-CM

## 2018-09-18 DIAGNOSIS — Z0000 Encounter for general adult medical examination without abnormal findings: Secondary | ICD-10-CM | POA: Diagnosis not present

## 2018-09-18 DIAGNOSIS — R030 Elevated blood-pressure reading, without diagnosis of hypertension: Secondary | ICD-10-CM | POA: Diagnosis not present

## 2018-09-18 DIAGNOSIS — Z8659 Personal history of other mental and behavioral disorders: Secondary | ICD-10-CM

## 2018-09-18 LAB — POCT URINALYSIS DIPSTICK
Appearance: NEGATIVE
Bilirubin, UA: NEGATIVE
Blood, UA: NEGATIVE
Glucose, UA: NEGATIVE
Ketones, UA: NEGATIVE
Leukocytes, UA: NEGATIVE
Nitrite, UA: NEGATIVE
Odor: NEGATIVE
Protein, UA: NEGATIVE
Spec Grav, UA: 1.01 (ref 1.010–1.025)
Urobilinogen, UA: 0.2 E.U./dL
pH, UA: 6.5 (ref 5.0–8.0)

## 2018-09-18 MED ORDER — AMLODIPINE BESYLATE 5 MG PO TABS: 5.0000 mg | ORAL_TABLET | Freq: Every day | ORAL | 3 refills | Status: DC

## 2018-09-18 NOTE — Progress Notes (Signed)
Subjective:    Patient ID: Jimmy [name], male    DOB: 05/31/1940, 78 y.o.   MRN: [id number]  HPI Pleasant 78 year old Male in today for routine health maintenance, annual Medicare wellness visit and evaluation of medical issues including hypertension.  Patient brings in multiple blood pressure readings.  He has a new home blood pressure monitor.  Despite this his blood pressure is running between 1 36-1 46 over 2-86.  Pulse is in the 50-70 range.  Still not sure if his blood pressure machine is accurate.  Blood pressure today is 150/80.  His weight is 212 pounds and his BMI is 32.71.  He could benefit from some weight loss.  He lost his long-term partner,Jimmy [name] in 4970 cardiac complications and history of lung cancer.  He continues to work.  He has been spending time with his brothers fishing.  This is been done for him.  He has a history of essential hypertension, gout, history of depression, history of hyperlipidemia.  He had an exercise tolerance test by Dr. Maurene Capes in 1998 which was negative.  He has strong family history of coronary disease in his father.  He had colonoscopy February 2014 which was normal.  Had Herpes zoster right temple 1998.  Cellulitis right foot 1996.  Social history: He is divorced.  Does not smoke.  Social alcohol consumption.  He has adult children.  He is Recruitment consultant.  Tetanus and pneumonia immunizations up-to-date.  Gets annual flu vaccine.  Family history: Father with history of coronary artery disease status post CABG, diabetes, heart problems.  Father had hypertension, gout and alcohol abuse.  Father deceased with coronary event.  Mother had history of stroke and hypertension.  She died suddenly him with acute arrest perhaps stroke or MI.  One sister died in a motor vehicle accident as a passenger with history of alcoholism.  2 sisters in good health.  2 brothers 1 of them has history of lymphoma but doing well.    Review of Systems  Constitutional: Negative.   All other systems reviewed and are negative.      Objective:   Physical Exam Vitals signs reviewed.  Constitutional:      General: He is not in acute distress.    Appearance: Normal appearance.  HENT:     Head: Normocephalic and atraumatic.     Right Ear: Tympanic membrane normal.     Left Ear: Tympanic membrane normal.     Nose: Nose normal.     Mouth/Throat:     Mouth: Mucous membranes are moist.     Pharynx: Oropharynx is clear.  Eyes:     General: No scleral icterus.       Right eye: No discharge.        Left eye: No discharge.     Conjunctiva/sclera: Conjunctivae normal.     Pupils: Pupils are equal, round, and reactive to light.  Neck:     Musculoskeletal: Neck supple. No neck rigidity.     Vascular: No carotid bruit.  Cardiovascular:     Rate and Rhythm: Normal rate and regular rhythm.     Heart sounds: Normal heart sounds. No murmur.  Pulmonary:     Effort: Pulmonary effort is normal. No respiratory distress.     Breath sounds: Normal breath sounds. No wheezing or rales.  Abdominal:     Palpations: Abdomen is soft.     Tenderness: There is no abdominal tenderness. There is no guarding.  Genitourinary:  Prostate: Normal.  Musculoskeletal:     Right lower leg: No edema.     Left lower leg: No edema.  Lymphadenopathy:     Cervical: No cervical adenopathy.  Skin:    General: Skin is warm and dry.  Neurological:     General: No focal deficit present.     Mental Status: He is alert and oriented to person, place, and time.  Psychiatric:        Mood and Affect: Mood normal.        Behavior: Behavior normal.        Thought Content: Thought content normal.        Judgment: Judgment normal.    Vital signs reviewed.       Assessment & Plan:  Essential hypertension-currently on amlodipine 5 mg daily and Mavik 1 mg daily.  I could increase Mavik to 2 mg daily.  He will let me know if blood pressure is  persistently elevated.  History of gout-treated with allopurinol  History of depression treated with Prozac  Hyperlipidemia treated with Zocor  Immunizations are up-to-date.  Shingrix vaccine discussed.  BMI 32.71.  Recommend diet and exercise.  This will help hypertension.    On statin therapy.  PSA is normal.  CBC is normal.  Lipids are normal.  Fasting glucose is 114.  Patient will continue to monitor blood pressure and follow-up in June.  Subjective:   Patient presents for Medicare Annual/Subsequent preventive examination.  Review Past Medical/Family/Social: See above family history of heart disease   Risk Factors  Current exercise habits: Walks around and enjoys fishing Dietary issues discussed: Low-fat low carbohydrate  Cardiac risk factors: Hypertension, family history, hyperlipidemia  Depression Screen  (Note: if answer to either of the following is "Yes", a more complete depression screening is indicated)   Over the past two weeks, have you felt down, depressed or hopeless? No  Over the past two weeks, have you felt little interest or pleasure in doing things? No Have you lost interest or pleasure in daily life? No Do you often feel hopeless? No Do you cry easily over simple problems? No   Activities of Daily Living  In your present state of health, do you have any difficulty performing the following activities?:   Driving? No  Managing money? No  Feeding yourself? No  Getting from bed to chair? No  Climbing a flight of stairs? No  Preparing food and eating?: No  Bathing or showering? No  Getting dressed: No  Getting to the toilet? No  Using the toilet:No  Moving around from place to place: No  In the past year have you fallen or had a near fall?:No  Are you sexually active? No  Do you have more than one partner? No   Hearing Difficulties: No  Do you often ask people to speak up or repeat themselves? No  Do you experience ringing or noises in your ears?  yes Do you have difficulty understanding soft or whispered voices? No  Do you feel that you have a problem with memory? No Do you often misplace items? No    Home Safety:  Do you have a smoke alarm at your residence? Yes Do you have grab bars in the bathroom?  no Do you have throw rugs in your house?  Yes   Cognitive Testing  Alert? Yes Normal Appearance?Yes  Oriented to person? Yes Place? Yes  Time? Yes  Recall of three objects? Yes  Can perform simple calculations? Yes  Displays appropriate judgment?Yes  Can read the correct time from a watch face?Yes   List the Names of Other Physician/Practitioners you currently use:  See referral list for the physicians patient is currently seeing.     Review of Systems: See above   Objective:     General appearance: Appears younger than stated age Head: Normocephalic, without obvious abnormality, atraumatic  Eyes: conj clear, EOMi PEERLA  Ears: normal TM's and external ear canals both ears  Nose: Nares normal. Septum midline. Mucosa normal. No drainage or sinus tenderness.  Throat: lips, mucosa, and tongue normal; teeth and gums normal  Neck: no adenopathy, no carotid bruit, no JVD, supple, symmetrical, trachea midline and thyroid not enlarged, symmetric, no tenderness/mass/nodules  No CVA tenderness.  Lungs: clear to auscultation bilaterally  Breasts: normal appearance, no masses or tenderness.  Heart: regular rate and rhythm, S1, S2 normal, no murmur, click, rub or gallop  Abdomen: soft, non-tender; bowel sounds normal; no masses, no organomegaly  Musculoskeletal: ROM normal in all joints, no crepitus, no deformity, Normal muscle strengthen. Back  is symmetric, no curvature. Skin: Skin color, texture, turgor normal. No rashes or lesions  Lymph nodes: Cervical, supraclavicular, and axillary nodes normal.  Neurologic: CN 2 -12 Normal, Normal symmetric reflexes. Normal coordination and gait  Psych: Alert & Oriented x 3, Mood  appear stable.    Assessment:    Annual wellness medicare exam   Plan:    During the course of the visit the patient was educated and counseled about appropriate screening and preventive services including:   Annual flu vaccine  Immunizations are up-to-date  Shingrix vaccine discussed    Patient Instructions (the written plan) was given to the patient.  Medicare Attestation  I have personally reviewed:  The patient's medical and social history  Their use of alcohol, tobacco or illicit drugs  Their current medications and supplements  The patient's functional ability including ADLs,fall risks, home safety risks, cognitive, and hearing and visual impairment  Diet and physical activities  Evidence for depression or mood disorders  The patient's weight, height, BMI, and visual acuity have been recorded in the chart. I have made referrals, counseling, and provided education to the patient based on review of the above and I have provided the patient with a written personalized care plan for preventive services.

## 2018-09-21 NOTE — Patient Instructions (Signed)
Continue same medications and return in about a month for follow-up.  We may need to adjust her blood pressure medications.  Continue statin medication.  It was a pleasure to see you today.

## 2018-10-21 ENCOUNTER — Encounter: Payer: Self-pay | Admitting: Internal Medicine

## 2018-10-21 ENCOUNTER — Ambulatory Visit (INDEPENDENT_AMBULATORY_CARE_PROVIDER_SITE_OTHER): Payer: Medicare Other | Admitting: Internal Medicine

## 2018-10-21 ENCOUNTER — Other Ambulatory Visit: Payer: Self-pay

## 2018-10-21 VITALS — BP 138/80 | HR 82 | Ht 67.5 in

## 2018-10-21 DIAGNOSIS — R030 Elevated blood-pressure reading, without diagnosis of hypertension: Secondary | ICD-10-CM | POA: Diagnosis not present

## 2018-10-21 DIAGNOSIS — I10 Essential (primary) hypertension: Secondary | ICD-10-CM

## 2018-10-21 DIAGNOSIS — E7800 Pure hypercholesterolemia, unspecified: Secondary | ICD-10-CM

## 2018-10-21 NOTE — Patient Instructions (Signed)
Continue amlodipine and increase trandolapril to 2 mg daily.  Follow-up in 4 weeks.

## 2018-10-21 NOTE — Progress Notes (Signed)
   Subjective:    Patient ID: Jimmy [name], male    DOB: [birth year], 78 y.o.   MRN: [id number]  HPI At last visit, amlodipine 5 mg daily was added to trandolapril 1 mg daily.  He brings in multiple blood pressure readings ranging in the 542H to 062B systolic.  He does not feel that there is been a great deal of change adding the amlodipine.    Review of Systems no new complaints today she has been fishing recently with her grandson and enjoyed that very much.     Objective:   Physical Exam  Blood pressure today is 138/80.  Pulse 82.  BMI 32.71.        Assessment & Plan:  Essential hypertension  Would like to see blood pressure control consistently 120-130/80.  We are increasing trandolapril to 2 mg daily and he will follow-up in 4 weeks.  He is asking for prescription for Viagra.  Given written prescription for generic Viagra 100 mg number 6 tablets with PRN 1 year refill

## 2018-10-27 ENCOUNTER — Other Ambulatory Visit: Payer: Self-pay | Admitting: Internal Medicine

## 2018-10-28 ENCOUNTER — Other Ambulatory Visit: Payer: Self-pay

## 2018-10-28 MED ORDER — SILDENAFIL CITRATE 100 MG PO TABS: 50.0000 mg | ORAL_TABLET | Freq: Every day | ORAL | 11 refills | Status: DC | PRN

## 2018-11-25 ENCOUNTER — Other Ambulatory Visit: Payer: Self-pay

## 2018-11-25 ENCOUNTER — Ambulatory Visit: Payer: Medicare Other | Admitting: Internal Medicine

## 2018-11-25 ENCOUNTER — Encounter: Payer: Self-pay | Admitting: Internal Medicine

## 2018-11-25 ENCOUNTER — Ambulatory Visit (INDEPENDENT_AMBULATORY_CARE_PROVIDER_SITE_OTHER): Payer: Medicare Other | Admitting: Internal Medicine

## 2018-11-25 VITALS — BP 110/70 | HR 64 | Temp 98.1°F | Ht 67.5 in | Wt 221.0 lb

## 2018-11-25 DIAGNOSIS — R600 Localized edema: Secondary | ICD-10-CM | POA: Diagnosis not present

## 2018-11-25 DIAGNOSIS — I10 Essential (primary) hypertension: Secondary | ICD-10-CM

## 2018-11-25 MED ORDER — HYDROCHLOROTHIAZIDE 25 MG PO TABS: 25.0000 mg | ORAL_TABLET | Freq: Every day | ORAL | 1 refills | Status: DC

## 2018-11-25 NOTE — Progress Notes (Signed)
   Subjective:    Patient ID: Jimmy [name], male    DOB: [birth year], 78 y.o.   MRN: [id number]  HPI 77 year old Male for follow up on hypertension.  For a long period of time he was maintained on Mavik 1 mg daily.  In May amlodipine 5 mg daily was added for improved control.  He was seen again in late June and Lifescape was increased to 2 mg daily.  Our aim was to have his blood pressure consistently be 120-130/80.  He brings in multiple blood pressure readings throughout July ranging from 132 to 440 systolically and 69 to 90 diastolically.  Review of Systems no new complaints     Objective:   Physical Exam Spent 15 minutes speaking with him.  He feels well.  No side effects from amlodipine.  No side effect from increased dose of Mavik.  Chest clear.  Cardiac exam regular rate and rhythm.       Assessment & Plan:  Essential hypertension  Mild lower extremity edema  Mild lower extremity edema-  weather has been extremely hot.  Usually amlodipine 5 mg does not cause significant edema.  Plan: Add HCTZ 25 mg daily and follow-up in a couple of weeks.  Continue amlodipine 5 mg daily and Mavik 2 mg daily.

## 2018-12-09 ENCOUNTER — Encounter: Payer: Self-pay | Admitting: Internal Medicine

## 2018-12-09 ENCOUNTER — Ambulatory Visit (INDEPENDENT_AMBULATORY_CARE_PROVIDER_SITE_OTHER): Payer: Medicare Other | Admitting: Internal Medicine

## 2018-12-09 ENCOUNTER — Other Ambulatory Visit: Payer: Self-pay

## 2018-12-09 VITALS — BP 120/70 | HR 74 | Temp 98.2°F | Wt 221.0 lb

## 2018-12-09 DIAGNOSIS — I10 Essential (primary) hypertension: Secondary | ICD-10-CM

## 2018-12-09 DIAGNOSIS — Z23 Encounter for immunization: Secondary | ICD-10-CM

## 2018-12-09 MED ORDER — HYDROCHLOROTHIAZIDE 25 MG PO TABS: 25.0000 mg | ORAL_TABLET | Freq: Every day | ORAL | 1 refills | Status: DC

## 2018-12-09 MED ORDER — TRANDOLAPRIL 2 MG PO TABS: 2.0000 mg | ORAL_TABLET | Freq: Every day | ORAL | 1 refills | Status: DC

## 2018-12-10 ENCOUNTER — Other Ambulatory Visit: Payer: Self-pay | Admitting: Internal Medicine

## 2018-12-10 LAB — BASIC METABOLIC PANEL
BUN: 14 mg/dL (ref 7–25)
CO2: 29 mmol/L (ref 20–32)
Calcium: 9.8 mg/dL (ref 8.6–10.3)
Chloride: 95 mmol/L — ABNORMAL LOW (ref 98–110)
Creat: 1 mg/dL (ref 0.70–1.18)
Glucose, Bld: 88 mg/dL (ref 65–99)
Potassium: 4.4 mmol/L (ref 3.5–5.3)
Sodium: 135 mmol/L (ref 135–146)

## 2018-12-12 NOTE — Progress Notes (Signed)
   Subjective:    Patient ID: Jimmy [name], male    DOB: 09-22-1940, 78 y.o.   MRN: [id number]  HPI here today to follow-up on hypertension.  At last visit, HCTZ was added to current regimen due to some lower extremity edema.  He now brings in multiple blood pressure readings which are extremely acceptable in the 120/80 range.  A basic metabolic panel was drawn today.  He has no side effects.  He is now on amlodipine 5 mg daily, HCTZ 25 mg daily, Zocor, Mavik 2 mg daily.    Review of Systems     Objective:   Physical Exam  Chest clear.  Cardiac exam regular rate and rhythm.  No significant lower extremity edema.  Basic metabolic panel drawn      Assessment & Plan:  Essential hypertension male well controlled on current regimen  Plan: Basic metabolic panel drawn.  Potassium was 4.4 and BUN and creatinine are within normal limits.  He will continue current regimen and follow-up in 6 months.  He will continue to monitor his blood pressure frequently at home.

## 2018-12-15 NOTE — Patient Instructions (Signed)
Continue HCTZ, Mavik 2 mg daily, amlodipine 5 mg daily as well as Zocor.  Continue to monitor blood pressure at home and call if elevated over several days.  Otherwise follow-up in 6 months.

## 2018-12-15 NOTE — Patient Instructions (Signed)
Add HCTZ to current regimen and follow-up in a couple of weeks

## 2018-12-22 ENCOUNTER — Encounter: Payer: Self-pay | Admitting: Internal Medicine

## 2018-12-22 ENCOUNTER — Ambulatory Visit (INDEPENDENT_AMBULATORY_CARE_PROVIDER_SITE_OTHER): Payer: Medicare Other | Admitting: Internal Medicine

## 2018-12-22 ENCOUNTER — Other Ambulatory Visit: Payer: Self-pay

## 2018-12-22 VITALS — BP 140/80 | HR 68 | Temp 98.6°F | Ht 67.5 in | Wt 221.0 lb

## 2018-12-22 DIAGNOSIS — B029 Zoster without complications: Secondary | ICD-10-CM | POA: Diagnosis not present

## 2018-12-22 MED ORDER — PREDNISONE 10 MG PO TABS: ORAL_TABLET | ORAL | 0 refills | Status: DC

## 2018-12-22 MED ORDER — VALACYCLOVIR HCL 1 G PO TABS: 1000.0000 mg | ORAL_TABLET | Freq: Three times a day (TID) | ORAL | 0 refills | Status: DC

## 2018-12-22 NOTE — Progress Notes (Signed)
   Subjective:    Patient ID: Jimmy [name], male    DOB: Mar 20, 1941, 78 y.o.   MRN: [id number]  HPI Onset sometime early to mid week of last week of left sided back pain. A couple of papules appeared left lower back that pt thought were insect bites. Subsequently developed pain radiating into left abdomen anteriorly.  A rash around his left trunk developed as well.  He did have Zostavax vaccine previously but has not had Shingrix vaccine.    Review of Systems see above no fever chills nausea or vomiting.  No headache.     Objective:   Physical Exam Vesiculopapular rash radiating around left trunk around T11 or T12 distribution without evidence of secondary infection. This is consistent with with Herpes zoster  Patient looks fatigued.  Did not sleep well last night.     Assessment & Plan:  Herpes zoster left trunk  Plan: Valtrex 1000 mg 3 times a day for 7 days.  Apply calamine lotion to lesions.  Prednisone 10 mg  # 21 tablets going from 60 mg to 0 mg over 7 days.  He declined offer for pain medication.  If symptoms persist consider gabapentin.  Offered pain medication which may or may not help but he declined.

## 2018-12-22 NOTE — Patient Instructions (Addendum)
Valtrex 1000 mg 3 times a day x 7 days. Take Prednisone in tapering course as directed 6-5-4-3-2-1. Calamine lotion to rash. Call if pain persists.

## 2019-04-27 ENCOUNTER — Encounter: Payer: Self-pay | Admitting: Internal Medicine

## 2019-04-27 ENCOUNTER — Telehealth: Payer: Self-pay | Admitting: Internal Medicine

## 2019-04-27 ENCOUNTER — Ambulatory Visit (INDEPENDENT_AMBULATORY_CARE_PROVIDER_SITE_OTHER): Payer: Medicare Other | Admitting: Internal Medicine

## 2019-04-27 ENCOUNTER — Other Ambulatory Visit: Payer: Self-pay

## 2019-04-27 VITALS — Temp 98.3°F | Ht 67.5 in | Wt 221.0 lb

## 2019-04-27 DIAGNOSIS — Z20822 Contact with and (suspected) exposure to covid-19: Secondary | ICD-10-CM | POA: Diagnosis not present

## 2019-04-27 NOTE — Telephone Encounter (Signed)
He needs to go to test center and needs virtual visit

## 2019-04-27 NOTE — Telephone Encounter (Signed)
Jimmy [name] has an appointment for Covid Testing tomorrow at 11:30, scheduled virtual visit for this afternoon

## 2019-04-27 NOTE — Telephone Encounter (Signed)
Jimmy [name] [phone]  Liliane Channel called to say he was exposed to [place] last Thursday and Friday for 2 hours each day. He has runny nose,sneezing, drainage, no fever.

## 2019-04-28 ENCOUNTER — Ambulatory Visit: Payer: Medicare Other | Attending: Internal Medicine

## 2019-04-28 DIAGNOSIS — Z20822 Contact with and (suspected) exposure to covid-19: Secondary | ICD-10-CM | POA: Diagnosis not present

## 2019-04-30 LAB — NOVEL CORONAVIRUS, NAA: SARS-CoV-2, NAA: DETECTED — AB

## 2019-05-13 NOTE — Telephone Encounter (Signed)
Faxed positive COVID results to [facility] Department

## 2019-05-17 NOTE — Progress Notes (Signed)
   Subjective:    Patient ID: Jimmy [name], male    DOB: Mar 24, 1941, 79 y.o.   MRN: [id number]  HPI 79 year old Male with history of hypertension called complaining of nasal congestion, headache, loss of taste and sense of smell.  Apparently he was exposed to COVID-19 having a meal with a friend who subsequently came down with COVID-19.  Patient is worried he may have COVID-19 as well.  He denies shortness of breath.  Has low-grade temperature.  Due to the Coronavirus pandemic he was attempted to be seen by interactive audio and video telecommunications today.  However, video communication could not be established due to technical difficulties.  Therefore we continued with audio alone.  He was agreeable to visit in this format.  He is identified using 2 identifiers as Jimmy [name] a patient in this practice.  Patient says he spent about 2 hours at the restaurant with a friend having a meal.  The friend that he was dining with was was asymptomatic at the time.    Review of Systems denies headache nausea or vomiting or diarrhea     Objective:   Physical Exam Reports low-grade temp and no shortness of breath       Assessment & Plan:  Probable COVID-19 infection  Plan: He will be seen at test center at [facility] for COVID-19 test.  He is to isolate for minimum of 10 days if he is positive.  I suspect he will be positive.  I will call to check on him in the next day or 2.  He will call me if he gets worse.

## 2019-05-17 NOTE — Patient Instructions (Signed)
Have COVID-19 test tomorrow at [facility] test center.  Quarantine until results are back and if Covid positive most quarantine for 10 days minimal.  Take Tylenol for fever.  Monitor pulse oximetry.  Call if symptoms worsen.  Stay well-hydrated.

## 2019-06-08 ENCOUNTER — Telehealth: Payer: Self-pay | Admitting: Internal Medicine

## 2019-06-08 MED ORDER — ALLOPURINOL 300 MG PO TABS: 300.0000 mg | ORAL_TABLET | Freq: Every day | ORAL | 3 refills | Status: DC

## 2019-06-08 NOTE — Telephone Encounter (Signed)
Received Fax RX request from  [facility] [id number] [address], [place] Phone:  [phone]  Fax:  [phone]       Medication - allopurinol (ZYLOPRIM) 300 MG tablet    Last Refill - 03/10/2019  Last OV - 05/07/2019  Last CPE - 09/18/18  Next Appointment - 06/11/2019

## 2019-06-09 ENCOUNTER — Other Ambulatory Visit: Payer: Medicare Other | Admitting: Internal Medicine

## 2019-06-09 ENCOUNTER — Other Ambulatory Visit: Payer: Self-pay

## 2019-06-09 DIAGNOSIS — E7800 Pure hypercholesterolemia, unspecified: Secondary | ICD-10-CM | POA: Diagnosis not present

## 2019-06-10 LAB — LIPID PANEL
Cholesterol: 140 mg/dL (ref <200)
HDL: 62 mg/dL (ref >=40)
LDL Cholesterol (Calc): 59 mg/dL (calc)
Non-HDL Cholesterol (Calc): 78 mg/dL (calc) (ref <130)
Total CHOL/HDL Ratio: 2.3 (calc) (ref <5.0)
Triglycerides: 100 mg/dL (ref <150)

## 2019-06-10 LAB — HEPATIC FUNCTION PANEL
AG Ratio: 1.8 (calc) (ref 1.0–2.5)
ALT: 22 U/L (ref 9–46)
AST: 22 U/L (ref 10–35)
Albumin: 4.2 g/dL (ref 3.6–5.1)
Alkaline phosphatase (APISO): 58 U/L (ref 35–144)
Bilirubin, Direct: 0.1 mg/dL (ref 0.0–0.2)
Globulin: 2.4 g/dL (calc) (ref 1.9–3.7)
Indirect Bilirubin: 0.3 mg/dL (calc) (ref 0.2–1.2)
Total Bilirubin: 0.4 mg/dL (ref 0.2–1.2)
Total Protein: 6.6 g/dL (ref 6.1–8.1)

## 2019-06-11 ENCOUNTER — Ambulatory Visit: Payer: Medicare Other | Admitting: Internal Medicine

## 2019-06-15 ENCOUNTER — Telehealth: Payer: Self-pay | Admitting: Internal Medicine

## 2019-06-15 ENCOUNTER — Ambulatory Visit: Payer: Self-pay

## 2019-06-15 MED ORDER — TRANDOLAPRIL 2 MG PO TABS: 2.0000 mg | ORAL_TABLET | Freq: Every day | ORAL | 1 refills | Status: DC

## 2019-06-15 NOTE — Addendum Note (Signed)
Addended by: Mady Haagensen on: 06/15/2019 02:55 PM   Modules accepted: Orders

## 2019-06-15 NOTE — Telephone Encounter (Signed)
Received Fax RX request from  [facility] [phone] [address], [place] Phone:  [phone]  Fax:  [phone]       Medication - trandolapril (Nixon) 2 MG tablet   Last Refill - 03/18/19  Last OV - 04/27/19  Last CPE - 09/18/18  Next Appointment - 06/16/19

## 2019-06-16 ENCOUNTER — Encounter: Payer: Self-pay | Admitting: Internal Medicine

## 2019-06-16 ENCOUNTER — Ambulatory Visit (INDEPENDENT_AMBULATORY_CARE_PROVIDER_SITE_OTHER): Payer: Medicare Other | Admitting: Internal Medicine

## 2019-06-16 ENCOUNTER — Other Ambulatory Visit: Payer: Self-pay

## 2019-06-16 VITALS — BP 120/80 | HR 80 | Temp 98.0°F | Ht 67.5 in | Wt 228.0 lb

## 2019-06-16 DIAGNOSIS — Z8616 Personal history of COVID-19: Secondary | ICD-10-CM | POA: Diagnosis not present

## 2019-06-16 DIAGNOSIS — Z8739 Personal history of other diseases of the musculoskeletal system and connective tissue: Secondary | ICD-10-CM | POA: Diagnosis not present

## 2019-06-16 DIAGNOSIS — I10 Essential (primary) hypertension: Secondary | ICD-10-CM

## 2019-06-16 DIAGNOSIS — Z6835 Body mass index (BMI) 35.0-35.9, adult: Secondary | ICD-10-CM | POA: Diagnosis not present

## 2019-06-16 DIAGNOSIS — E7800 Pure hypercholesterolemia, unspecified: Secondary | ICD-10-CM | POA: Diagnosis not present

## 2019-06-16 NOTE — Progress Notes (Signed)
   Subjective:    Patient ID: Jimmy [name], male    DOB: 09-24-1940, 79 y.o.   MRN: [id number]  HPI  79 year old Male for 59-month follow-up.  He has a history of hypertension and gout.  In January he came down with COVID-19 but had a fairly mild case and recovered quite well.  No side effects at the present time.  In August 2020 he had Herpes zoster without complication and will get Shingrix vaccine in the near future.  Recently he was able to get first COVID-19 injection at pharmacy.      Review of Systems     Objective:   Physical Exam Blood pressure 120/80 pulse 80 temperature 98 degrees pulse oximetry 97% weight 228 pounds BMI 35.18.  Talked about diet exercise and weight loss.  Skin warm and dry.  Nodes none.  Neck is supple without JVD thyromegaly or carotid bruits.  Chest clear to auscultation.  Cardiac exam regular rate and rhythm.  Affect thought and judgment are normal.  He looks well.       Assessment & Plan:  Essential hypertension-stable on current regimen  History of COVID-19 infection has recovered  History of Herpes zoster-can get Shingrix vaccine in the near future  History of gout-no recent episodes  BMI 35-encourage diet exercise and weight loss.  Plan: He will be returning in July for health maintenance exam and fasting labs.  As well as Medicare wellness visit.

## 2019-06-21 NOTE — Patient Instructions (Signed)
It was a pleasure to see you today.  Continue current medications.  Have second Covid vaccine soon.  May get herpes zoster/Shingrix vaccine in the near future several weeks after second COVID-19 vaccine.  Return in July for health maintenance exam.  Please watch diet, get some more exercise and try to lose a bit of weight.

## 2019-07-01 ENCOUNTER — Other Ambulatory Visit: Payer: Self-pay

## 2019-07-01 ENCOUNTER — Ambulatory Visit: Payer: Medicare Other | Admitting: Podiatry

## 2019-07-01 VITALS — Temp 96.3°F

## 2019-07-01 DIAGNOSIS — L84 Corns and callosities: Secondary | ICD-10-CM

## 2019-07-01 DIAGNOSIS — M2041 Other hammer toe(s) (acquired), right foot: Secondary | ICD-10-CM

## 2019-07-01 NOTE — Progress Notes (Signed)
Subjective:   Patient ID: Jimmy [name], male   DOB: 79 y.o.   MRN: [id number]   HPI Patient presents with chronic lesion on the fifth toe right that is been sore with mild on the left with digital deformities of both fifth toes and is interested in what can be done long-term   ROS      Objective:  Physical Exam  Neurovascular status intact with patient found to have a rotated fifth digit right with keratotic lesion head of proximal phalanx that is moderately painful when pressed and make shoe gear difficult with digital deformity at digit 5 both feet     Assessment:  Hammertoe deformity digit 5 bilateral with keratotic lesion that is been irritative right     Plan:  H&P condition reviewed and I debrided lesion today with no iatrogenic bleeding applied padding to the toe instructed on hammertoe repair and reviewed arthroplasty with patient.  This may be something we will get a do will get a hold off currently and see the response to conservative treatment

## 2019-07-02 DIAGNOSIS — H25813 Combined forms of age-related cataract, bilateral: Secondary | ICD-10-CM | POA: Diagnosis not present

## 2019-07-02 LAB — HM DIABETES EYE EXAM

## 2019-07-10 ENCOUNTER — Other Ambulatory Visit: Payer: Self-pay

## 2019-07-10 MED ORDER — HYDROCHLOROTHIAZIDE 25 MG PO TABS: 25.0000 mg | ORAL_TABLET | Freq: Every day | ORAL | 1 refills | Status: DC

## 2019-07-14 ENCOUNTER — Other Ambulatory Visit: Payer: Self-pay

## 2019-07-14 MED ORDER — HYDROCHLOROTHIAZIDE 25 MG PO TABS: 25.0000 mg | ORAL_TABLET | Freq: Every day | ORAL | 3 refills | Status: DC

## 2019-07-15 DIAGNOSIS — M545 Low back pain: Secondary | ICD-10-CM | POA: Diagnosis not present

## 2019-07-15 DIAGNOSIS — R269 Unspecified abnormalities of gait and mobility: Secondary | ICD-10-CM | POA: Diagnosis not present

## 2019-07-20 DIAGNOSIS — R269 Unspecified abnormalities of gait and mobility: Secondary | ICD-10-CM | POA: Diagnosis not present

## 2019-07-20 DIAGNOSIS — M545 Low back pain: Secondary | ICD-10-CM | POA: Diagnosis not present

## 2019-07-27 DIAGNOSIS — M545 Low back pain: Secondary | ICD-10-CM | POA: Diagnosis not present

## 2019-07-27 DIAGNOSIS — R269 Unspecified abnormalities of gait and mobility: Secondary | ICD-10-CM | POA: Diagnosis not present

## 2019-08-03 DIAGNOSIS — M545 Low back pain: Secondary | ICD-10-CM | POA: Diagnosis not present

## 2019-08-03 DIAGNOSIS — R269 Unspecified abnormalities of gait and mobility: Secondary | ICD-10-CM | POA: Diagnosis not present

## 2019-08-10 DIAGNOSIS — R269 Unspecified abnormalities of gait and mobility: Secondary | ICD-10-CM | POA: Diagnosis not present

## 2019-08-10 DIAGNOSIS — M545 Low back pain: Secondary | ICD-10-CM | POA: Diagnosis not present

## 2019-08-17 DIAGNOSIS — R269 Unspecified abnormalities of gait and mobility: Secondary | ICD-10-CM | POA: Diagnosis not present

## 2019-08-17 DIAGNOSIS — M545 Low back pain: Secondary | ICD-10-CM | POA: Diagnosis not present

## 2019-08-25 DIAGNOSIS — R269 Unspecified abnormalities of gait and mobility: Secondary | ICD-10-CM | POA: Diagnosis not present

## 2019-08-25 DIAGNOSIS — M545 Low back pain: Secondary | ICD-10-CM | POA: Diagnosis not present

## 2019-09-01 ENCOUNTER — Other Ambulatory Visit: Payer: Self-pay

## 2019-09-01 MED ORDER — AMLODIPINE BESYLATE 5 MG PO TABS: 5.0000 mg | ORAL_TABLET | Freq: Every day | ORAL | 0 refills | Status: DC

## 2019-09-08 DIAGNOSIS — R269 Unspecified abnormalities of gait and mobility: Secondary | ICD-10-CM | POA: Diagnosis not present

## 2019-09-08 DIAGNOSIS — M545 Low back pain: Secondary | ICD-10-CM | POA: Diagnosis not present

## 2019-09-22 DIAGNOSIS — M545 Low back pain: Secondary | ICD-10-CM | POA: Diagnosis not present

## 2019-09-22 DIAGNOSIS — R269 Unspecified abnormalities of gait and mobility: Secondary | ICD-10-CM | POA: Diagnosis not present

## 2019-10-06 DIAGNOSIS — R269 Unspecified abnormalities of gait and mobility: Secondary | ICD-10-CM | POA: Diagnosis not present

## 2019-10-06 DIAGNOSIS — M545 Low back pain: Secondary | ICD-10-CM | POA: Diagnosis not present

## 2019-10-11 ENCOUNTER — Telehealth: Payer: Self-pay | Admitting: Internal Medicine

## 2019-10-11 MED ORDER — TRANDOLAPRIL 2 MG PO TABS: 2.0000 mg | ORAL_TABLET | Freq: Every day | ORAL | 0 refills | Status: DC

## 2019-10-11 MED ORDER — FLUOXETINE HCL 20 MG PO CAPS: ORAL_CAPSULE | ORAL | 0 refills | Status: DC

## 2019-10-11 NOTE — Telephone Encounter (Signed)
Has CPE in July Needs refills on Trandolapril 2 mg and fluoxetine 20 mg daily- escribed 90 days on both MJB,MD

## 2019-10-27 DIAGNOSIS — M545 Low back pain: Secondary | ICD-10-CM | POA: Diagnosis not present

## 2019-10-27 DIAGNOSIS — R269 Unspecified abnormalities of gait and mobility: Secondary | ICD-10-CM | POA: Diagnosis not present

## 2019-11-05 ENCOUNTER — Other Ambulatory Visit: Payer: Self-pay

## 2019-11-05 ENCOUNTER — Other Ambulatory Visit: Payer: Medicare Other | Admitting: Internal Medicine

## 2019-11-05 DIAGNOSIS — Z0000 Encounter for general adult medical examination without abnormal findings: Secondary | ICD-10-CM

## 2019-11-05 DIAGNOSIS — E7800 Pure hypercholesterolemia, unspecified: Secondary | ICD-10-CM | POA: Diagnosis not present

## 2019-11-05 DIAGNOSIS — Z8739 Personal history of other diseases of the musculoskeletal system and connective tissue: Secondary | ICD-10-CM

## 2019-11-05 DIAGNOSIS — Z125 Encounter for screening for malignant neoplasm of prostate: Secondary | ICD-10-CM

## 2019-11-05 DIAGNOSIS — I10 Essential (primary) hypertension: Secondary | ICD-10-CM

## 2019-11-06 ENCOUNTER — Encounter: Payer: Self-pay | Admitting: Internal Medicine

## 2019-11-06 ENCOUNTER — Ambulatory Visit (INDEPENDENT_AMBULATORY_CARE_PROVIDER_SITE_OTHER): Payer: Medicare Other | Admitting: Internal Medicine

## 2019-11-06 VITALS — BP 100/70 | HR 73 | Ht 67.5 in | Wt 229.0 lb

## 2019-11-06 DIAGNOSIS — H6123 Impacted cerumen, bilateral: Secondary | ICD-10-CM

## 2019-11-06 DIAGNOSIS — Z6835 Body mass index (BMI) 35.0-35.9, adult: Secondary | ICD-10-CM | POA: Diagnosis not present

## 2019-11-06 DIAGNOSIS — Z8619 Personal history of other infectious and parasitic diseases: Secondary | ICD-10-CM

## 2019-11-06 DIAGNOSIS — Z8739 Personal history of other diseases of the musculoskeletal system and connective tissue: Secondary | ICD-10-CM

## 2019-11-06 DIAGNOSIS — Z0000 Encounter for general adult medical examination without abnormal findings: Secondary | ICD-10-CM

## 2019-11-06 DIAGNOSIS — Z8659 Personal history of other mental and behavioral disorders: Secondary | ICD-10-CM

## 2019-11-06 DIAGNOSIS — E7800 Pure hypercholesterolemia, unspecified: Secondary | ICD-10-CM

## 2019-11-06 DIAGNOSIS — I10 Essential (primary) hypertension: Secondary | ICD-10-CM

## 2019-11-06 DIAGNOSIS — Z8616 Personal history of COVID-19: Secondary | ICD-10-CM

## 2019-11-06 LAB — CBC WITH DIFFERENTIAL/PLATELET
Absolute Monocytes: 514 cells/uL (ref 200–950)
Basophils Absolute: 48 cells/uL (ref 0–200)
Basophils Relative: 1 %
Eosinophils Absolute: 82 cells/uL (ref 15–500)
Eosinophils Relative: 1.7 %
HCT: 47 % (ref 38.5–50.0)
Hemoglobin: 15.7 g/dL (ref 13.2–17.1)
Lymphs Abs: 1229 cells/uL (ref 850–3900)
MCH: 31.7 pg (ref 27.0–33.0)
MCHC: 33.4 g/dL (ref 32.0–36.0)
MCV: 94.8 fL (ref 80.0–100.0)
MPV: 10.4 fL (ref 7.5–12.5)
Monocytes Relative: 10.7 %
Neutro Abs: 2928 cells/uL (ref 1500–7800)
Neutrophils Relative %: 61 %
Platelets: 185 10*3/uL (ref 140–400)
RBC: 4.96 10*6/uL (ref 4.20–5.80)
RDW: 13.1 % (ref 11.0–15.0)
Total Lymphocyte: 25.6 %
WBC: 4.8 10*3/uL (ref 3.8–10.8)

## 2019-11-06 LAB — COMPLETE METABOLIC PANEL WITH GFR
AG Ratio: 1.8 (calc) (ref 1.0–2.5)
ALT: 18 U/L (ref 9–46)
AST: 17 U/L (ref 10–35)
Albumin: 4.3 g/dL (ref 3.6–5.1)
Alkaline phosphatase (APISO): 65 U/L (ref 35–144)
BUN: 12 mg/dL (ref 7–25)
CO2: 28 mmol/L (ref 20–32)
Calcium: 9.7 mg/dL (ref 8.6–10.3)
Chloride: 99 mmol/L (ref 98–110)
Creat: 0.99 mg/dL (ref 0.70–1.18)
GFR, Est African American: 85 mL/min/{1.73_m2} (ref >=60)
GFR, Est Non African American: 73 mL/min/{1.73_m2} (ref >=60)
Globulin: 2.4 g/dL (calc) (ref 1.9–3.7)
Glucose, Bld: 103 mg/dL — ABNORMAL HIGH (ref 65–99)
Potassium: 4.6 mmol/L (ref 3.5–5.3)
Sodium: 137 mmol/L (ref 135–146)
Total Bilirubin: 0.6 mg/dL (ref 0.2–1.2)
Total Protein: 6.7 g/dL (ref 6.1–8.1)

## 2019-11-06 LAB — POCT URINALYSIS DIPSTICK
Appearance: NEGATIVE
Bilirubin, UA: NEGATIVE
Blood, UA: NEGATIVE
Glucose, UA: NEGATIVE
Ketones, UA: NEGATIVE
Leukocytes, UA: NEGATIVE
Nitrite, UA: NEGATIVE
Odor: NEGATIVE
Protein, UA: NEGATIVE
Spec Grav, UA: 1.01 (ref 1.010–1.025)
Urobilinogen, UA: 0.2 E.U./dL
pH, UA: 6.5 (ref 5.0–8.0)

## 2019-11-06 LAB — PSA: PSA: 0.6 ng/mL (ref <=4.0)

## 2019-11-06 LAB — LIPID PANEL
Cholesterol: 156 mg/dL (ref <200)
HDL: 62 mg/dL (ref >=40)
LDL Cholesterol (Calc): 72 mg/dL (calc)
Non-HDL Cholesterol (Calc): 94 mg/dL (calc) (ref <130)
Total CHOL/HDL Ratio: 2.5 (calc) (ref <5.0)
Triglycerides: 132 mg/dL (ref <150)

## 2019-11-06 NOTE — Patient Instructions (Signed)
Referral to ENT for cerumen impaction removal.  Continue current medications and follow-up in 6 months.  Work on diet exercise and weight loss.  Was a pleasure to see you today.  Have Shingrix vaccines

## 2019-11-06 NOTE — Progress Notes (Addendum)
Subjective:    Patient ID: Jimmy [name], male    DOB: 24-Jun-1940, 79 y.o.   MRN: [id number]  HPI  79 year old Male for health maintenance exam, medicare wellness and evaluation of medical issues.History of HTN well controlled on current regimen of Mavik 2 mg daily, HCTZ 25 mg daily and amlodipine 5 mg daily.  Weight is 229 pounds and was 212 pounds in May 2020.  Discussion about diet exercise and weight loss.  BMI has increased from 32.71-35.44.  He has been doing some fishing and take some overnight trips from time to time to various lakes.  Likes to travel in the [facility] on the weekends sometimes.  His long-term partner, Eber Hong passed away in 2017/07/22 from cardiac complications and history of lung cancer.  He has a history of essential hypertension, gout, history of depression, history of hyperlipidemia.  He had an exercise tolerance test by Tilda Burrow in 07-22-1996 which was negative.   Family history of coronary artery disease in his father.  Colonoscopy done July 22, 2012 by Dr. Delfin Edis which was normal and 10-year follow-up recommended.  He has been vaccinated for COVID-19.  In January, he tested positive for COVID-19 but had only mild symptoms.  Gets annual flu vaccine.  Has prescription for the 2 Shingrix vaccines and he will get that done in the near future.  Tetanus immunization is up-to-date.  He has had Pneumovax and Prevnar 13 immunizations.  History of Herpes zoster right temple 1998.  In August 2020 he had rash around his left trunk consistent with Herpes zoster.  At that time he had had Zostavax vaccine previously.  Social history: He is divorced.  Does not smoke.  He has adult children.  Social alcohol consumption.  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  He sees [facility] for eye exam.  Last eye exam was 2019/07/23.  Labs reviewed.  Fasting glucose is 103.  BUN and creatinine are normal.  Electrolytes are normal.  Liver functions are normal.   Lipid panel is entirely normal on statin medication.  Family history: Father with history of coronary artery disease status post CABG passed with an acute coronary event.  Had gout and alcohol abuse.  Mother had history of stroke and hypertension.  She died suddenly with an acute arrest perhaps  stroke or MI.  1 sister died in motor vehicle accident as a passenger with history of alcoholism.  2 sisters in good health.  2 brothers-one brother has history of lymphoma but doing well.  Review of Systems  Constitutional: Negative.   HENT: Positive for tinnitus.   Respiratory: Negative.   Cardiovascular: Negative.   Gastrointestinal: Negative.   Genitourinary: Negative.   Musculoskeletal: Negative.   Neurological: Negative.   Psychiatric/Behavioral: Negative.        Objective:   Physical Exam Vitals reviewed.  Constitutional:      Appearance: Normal appearance. He is obese.  HENT:     Head: Normocephalic and atraumatic.     Ears:     Comments: Impacted cerumen bilaterally    Nose: Nose normal.  Eyes:     General: No scleral icterus.       Right eye: No discharge.        Left eye: No discharge.     Extraocular Movements: Extraocular movements intact.     Conjunctiva/sclera: Conjunctivae normal.     Pupils: Pupils are equal, round, and reactive to light.  Cardiovascular:  Rate and Rhythm: Normal rate and regular rhythm.     Pulses: Normal pulses.     Heart sounds: Normal heart sounds. No murmur heard.   Pulmonary:     Effort: No respiratory distress.     Breath sounds: Normal breath sounds. No wheezing or rales.  Abdominal:     General: Bowel sounds are normal.     Palpations: Abdomen is soft. There is no mass.     Tenderness: There is no abdominal tenderness. There is no rebound.     Hernia: No hernia is present.  Genitourinary:    Prostate: Normal.  Musculoskeletal:     Right lower leg: No edema.     Left lower leg: No edema.  Skin:    General: Skin is warm and  dry.     Findings: No rash.  Neurological:     General: No focal deficit present.     Mental Status: He is alert and oriented to person, place, and time.  Psychiatric:        Mood and Affect: Mood normal.        Behavior: Behavior normal.        Thought Content: Thought content normal.        Judgment: Judgment normal.    BP 100/70 pulse 73 weight 229 pounds, Weight 35.34 Bilateral impacted cerumen.      Assessment & Plan:  Bilateral impacted cerumen- refer to ENT for removal.  BMI 35.3 4-8 work on diet exercise and weight loss  Essential hypertension stable on 3 drug regimen  Hyperlipidemia-stable and normal with statin medication  History of tinnitus  History of depression treated with Prozac and stable  Health maintenance-to have Shingrix vaccine in the near future  History of gout-treated with allopurinol  Plan: Continue current medications and follow-up in 6 months.  Will refer to ENT regarding earwax removal.  Subjective:   Patient presents for Medicare Annual/Subsequent preventive examination.  Review Past Medical/Family/Social: See above   Risk Factors  Current exercise habits: Some exercise with fishing Dietary issues discussed: Low-fat low carbohydrate advised  Cardiac risk factors: Family history, hyperlipidemia  Depression Screen  (Note: if answer to either of the following is "Yes", a more complete depression screening is indicated)   Over the past two weeks, have you felt down, depressed or hopeless? No  Over the past two weeks, have you felt little interest or pleasure in doing things? No Have you lost interest or pleasure in daily life? No Do you often feel hopeless? No Do you cry easily over simple problems? No   Activities of Daily Living  In your present state of health, do you have any difficulty performing the following activities?:   Driving? No  Managing money? No  Feeding yourself? No  Getting from bed to chair? No  Climbing a  flight of stairs? No  Preparing food and eating?: No  Bathing or showering? No  Getting dressed: No  Getting to the toilet? No  Using the toilet:No  Moving around from place to place: No  In the past year have you fallen or had a near fall?:No  Are you sexually active? No  Do you have more than one partner? No   Hearing Difficulties: No  Do you often ask people to speak up or repeat themselves? No  Do you experience ringing or noises in your ears?  Yes Do you have difficulty understanding soft or whispered voices? No  Do you feel that you have a problem  with memory? No Do you often misplace items? No    Home Safety:  Do you have a smoke alarm at your residence? Yes Do you have grab bars in the bathroom?  Yes Do you have throw rugs in your house?  Is   Cognitive Testing  Alert? Yes Normal Appearance?Yes  Oriented to person? Yes Place? Yes  Time? Yes  Recall of three objects? Yes  Can perform simple calculations? Yes  Displays appropriate judgment?Yes  Can read the correct time from a watch face?Yes   List the Names of Other Physician/Practitioners you currently use:  See referral list for the physicians patient is currently seeing.     Review of Systems: See above   Objective:     General appearance: No acute distress.  Obese  Head: Normocephalic, without obvious abnormality, atraumatic  Eyes: conj clear, EOMi PEERLA  Ears: normal TM's and external ear canals both ears  Nose: Nares normal. Septum midline. Mucosa normal. No drainage or sinus tenderness.  Throat: lips, mucosa, and tongue normal; teeth and gums normal  Neck: no adenopathy, no carotid bruit, no JVD, supple, symmetrical, trachea midline and thyroid not enlarged, symmetric, no tenderness/mass/nodules  No CVA tenderness.  Lungs: clear to auscultation bilaterally  Breasts: normal appearance, no masses or tenderness Heart: regular rate and rhythm, S1, S2 normal, no murmur, click, rub or gallop  Abdomen:  soft, non-tender; bowel sounds normal; no masses, no organomegaly  Musculoskeletal: ROM normal in all joints, no crepitus, no deformity, Normal muscle strengthen. Back  is symmetric, no curvature. Skin: Skin color, texture, turgor normal. No rashes or lesions  Lymph nodes: Cervical, supraclavicular, and axillary nodes normal.  Neurologic: CN 2 -12 Normal, Normal symmetric reflexes. Normal coordination and gait  Psych: Alert & Oriented x 3, Mood appear stable.    Assessment:    Annual wellness medicare exam   Plan:    During the course of the visit the patient was educated and counseled about appropriate screening and preventive services including:   Annual flu vaccine  To have Shingrix vaccine in the future      Patient Instructions (the written plan) was given to the patient.  Medicare Attestation  I have personally reviewed:  The patient's medical and social history  Their use of alcohol, tobacco or illicit drugs  Their current medications and supplements  The patient's functional ability including ADLs,fall risks, home safety risks, cognitive, and hearing and visual impairment  Diet and physical activities  Evidence for depression or mood disorders  The patient's weight, height, BMI, and visual acuity have been recorded in the chart. I have made referrals, counseling, and provided education to the patient based on review of the above and I have provided the patient with a written personalized care plan for preventive services.

## 2019-11-09 ENCOUNTER — Telehealth: Payer: Self-pay | Admitting: Internal Medicine

## 2019-11-09 NOTE — Telephone Encounter (Signed)
Faxed referral to [facility] [phone] phone [phone]

## 2019-11-13 NOTE — Telephone Encounter (Signed)
Called to check on referral, appointment not scheduled. So they said to have patient call and schedule appointment. I called and gave patient the phone number, he was going to hang up and call them immediately.

## 2019-11-17 DIAGNOSIS — M545 Low back pain: Secondary | ICD-10-CM | POA: Diagnosis not present

## 2019-11-17 DIAGNOSIS — R269 Unspecified abnormalities of gait and mobility: Secondary | ICD-10-CM | POA: Diagnosis not present

## 2019-12-01 DIAGNOSIS — H6123 Impacted cerumen, bilateral: Secondary | ICD-10-CM | POA: Diagnosis not present

## 2019-12-05 ENCOUNTER — Encounter: Payer: Self-pay | Admitting: Internal Medicine

## 2019-12-05 ENCOUNTER — Telehealth: Payer: Self-pay | Admitting: Internal Medicine

## 2019-12-05 MED ORDER — AMLODIPINE BESYLATE 5 MG PO TABS: 5.0000 mg | ORAL_TABLET | Freq: Every day | ORAL | 3 refills | Status: DC

## 2019-12-05 NOTE — Telephone Encounter (Signed)
[facility] faxed request here for refill on Amlodipine. Have refilled electronically for one year today. MJB,MD

## 2019-12-08 DIAGNOSIS — M545 Low back pain: Secondary | ICD-10-CM | POA: Diagnosis not present

## 2019-12-08 DIAGNOSIS — R269 Unspecified abnormalities of gait and mobility: Secondary | ICD-10-CM | POA: Diagnosis not present

## 2019-12-30 DIAGNOSIS — R269 Unspecified abnormalities of gait and mobility: Secondary | ICD-10-CM | POA: Diagnosis not present

## 2019-12-30 DIAGNOSIS — M545 Low back pain: Secondary | ICD-10-CM | POA: Diagnosis not present

## 2020-01-04 ENCOUNTER — Other Ambulatory Visit: Payer: Self-pay | Admitting: Internal Medicine

## 2020-01-12 ENCOUNTER — Other Ambulatory Visit: Payer: Self-pay | Admitting: Internal Medicine

## 2020-01-13 ENCOUNTER — Other Ambulatory Visit: Payer: Self-pay

## 2020-01-13 ENCOUNTER — Encounter: Payer: Self-pay | Admitting: Internal Medicine

## 2020-01-13 ENCOUNTER — Ambulatory Visit (INDEPENDENT_AMBULATORY_CARE_PROVIDER_SITE_OTHER): Payer: Medicare Other | Admitting: Internal Medicine

## 2020-01-13 VITALS — BP 120/70 | HR 88 | Temp 98.1°F | Ht 67.5 in | Wt 229.0 lb

## 2020-01-13 DIAGNOSIS — Z23 Encounter for immunization: Secondary | ICD-10-CM

## 2020-01-13 NOTE — Patient Instructions (Signed)
Patient received a flu vaccine IM L deltoid, AV, CMA  

## 2020-01-13 NOTE — Progress Notes (Signed)
Flu vaccine given by CMA. MJB, MD °

## 2020-01-27 DIAGNOSIS — M545 Low back pain, unspecified: Secondary | ICD-10-CM | POA: Diagnosis not present

## 2020-01-27 DIAGNOSIS — R269 Unspecified abnormalities of gait and mobility: Secondary | ICD-10-CM | POA: Diagnosis not present

## 2020-02-10 ENCOUNTER — Other Ambulatory Visit: Payer: Self-pay

## 2020-02-10 ENCOUNTER — Encounter: Payer: Self-pay | Admitting: Podiatry

## 2020-02-10 ENCOUNTER — Ambulatory Visit: Payer: Medicare Other | Admitting: Podiatry

## 2020-02-10 DIAGNOSIS — M2041 Other hammer toe(s) (acquired), right foot: Secondary | ICD-10-CM

## 2020-02-10 DIAGNOSIS — L84 Corns and callosities: Secondary | ICD-10-CM | POA: Diagnosis not present

## 2020-02-10 NOTE — Progress Notes (Signed)
This patient presents the office with chief complaint of a painful corn on his fifth toe right foot patient's states that this corn comes back and causes pain and discomfort walking and wearing his shoes.  He was last seen by Dr. Regal in March 2021.  He says that his toe feels good afterwards worked on.  He presents the office today for treatment of his painful corn.  Vascular  Dorsalis pedis and posterior tibial pulses are palpable  B/L.  Capillary return  WNL.  Temperature gradient is  WNL.  Skin turgor  WNL  Sensorium  Senn Weinstein monofilament wire  WNL. Normal tactile sensation.  Nail Exam  Patient has normal nails with no evidence of bacterial or fungal infection.  Orthopedic  Exam  Muscle tone and muscle strength  WNL.  No limitations of motion feet  B/L.  No crepitus or joint effusion noted.  Hammer toe fifth toe right foot..  Bony prominences are unremarkable.  Skin  No open lesions.  Normal skin texture and turgor.  Heloma durum fifth toe right foot.  Heloma durum fifth toe secondary to hammer toe fifth right.  Debride  HD with # 15 blade. Told to use padding as needed.  RTC prn.   Blondine Hottel DPM 

## 2020-02-17 DIAGNOSIS — M545 Low back pain, unspecified: Secondary | ICD-10-CM | POA: Diagnosis not present

## 2020-02-17 DIAGNOSIS — R269 Unspecified abnormalities of gait and mobility: Secondary | ICD-10-CM | POA: Diagnosis not present

## 2020-03-06 ENCOUNTER — Other Ambulatory Visit: Payer: Self-pay | Admitting: Internal Medicine

## 2020-03-09 DIAGNOSIS — R269 Unspecified abnormalities of gait and mobility: Secondary | ICD-10-CM | POA: Diagnosis not present

## 2020-03-09 DIAGNOSIS — M545 Low back pain, unspecified: Secondary | ICD-10-CM | POA: Diagnosis not present

## 2020-03-17 ENCOUNTER — Emergency Department (HOSPITAL_COMMUNITY): Payer: Medicare Other

## 2020-03-17 ENCOUNTER — Emergency Department (HOSPITAL_COMMUNITY)
Admission: EM | Admit: 2020-03-17 | Discharge: 2020-03-17 | Disposition: A | Discharge status: Home / Self Care | Payer: Medicare Other | Attending: Emergency Medicine | Admitting: Emergency Medicine

## 2020-03-17 ENCOUNTER — Other Ambulatory Visit: Payer: Self-pay

## 2020-03-17 DIAGNOSIS — Z79899 Other long term (current) drug therapy: Secondary | ICD-10-CM | POA: Insufficient documentation

## 2020-03-17 DIAGNOSIS — R6889 Other general symptoms and signs: Secondary | ICD-10-CM | POA: Diagnosis not present

## 2020-03-17 DIAGNOSIS — R1111 Vomiting without nausea: Secondary | ICD-10-CM | POA: Diagnosis not present

## 2020-03-17 DIAGNOSIS — Z7982 Long term (current) use of aspirin: Secondary | ICD-10-CM | POA: Diagnosis not present

## 2020-03-17 DIAGNOSIS — R404 Transient alteration of awareness: Secondary | ICD-10-CM | POA: Diagnosis not present

## 2020-03-17 DIAGNOSIS — I10 Essential (primary) hypertension: Secondary | ICD-10-CM | POA: Diagnosis not present

## 2020-03-17 DIAGNOSIS — R42 Dizziness and giddiness: Secondary | ICD-10-CM | POA: Diagnosis not present

## 2020-03-17 DIAGNOSIS — R110 Nausea: Secondary | ICD-10-CM | POA: Diagnosis not present

## 2020-03-17 DIAGNOSIS — Z87891 Personal history of nicotine dependence: Secondary | ICD-10-CM | POA: Diagnosis not present

## 2020-03-17 DIAGNOSIS — Z743 Need for continuous supervision: Secondary | ICD-10-CM | POA: Diagnosis not present

## 2020-03-17 DIAGNOSIS — E876 Hypokalemia: Secondary | ICD-10-CM | POA: Diagnosis not present

## 2020-03-17 DIAGNOSIS — R112 Nausea with vomiting, unspecified: Secondary | ICD-10-CM | POA: Diagnosis not present

## 2020-03-17 LAB — CBC WITH DIFFERENTIAL/PLATELET
Abs Immature Granulocytes: 0.03 10*3/uL (ref 0.00–0.07)
Basophils Absolute: 0 10*3/uL (ref 0.0–0.1)
Basophils Relative: 1 %
Eosinophils Absolute: 0.1 10*3/uL (ref 0.0–0.5)
Eosinophils Relative: 1 %
HCT: 42.8 % (ref 39.0–52.0)
Hemoglobin: 14.7 g/dL (ref 13.0–17.0)
Immature Granulocytes: 1 %
Lymphocytes Relative: 18 %
Lymphs Abs: 1.2 10*3/uL (ref 0.7–4.0)
MCH: 33 pg (ref 26.0–34.0)
MCHC: 34.3 g/dL (ref 30.0–36.0)
MCV: 96 fL (ref 80.0–100.0)
Monocytes Absolute: 0.5 10*3/uL (ref 0.1–1.0)
Monocytes Relative: 8 %
Neutro Abs: 4.7 10*3/uL (ref 1.7–7.7)
Neutrophils Relative %: 71 %
Platelets: 156 10*3/uL (ref 150–400)
RBC: 4.46 MIL/uL (ref 4.22–5.81)
RDW: 13.2 % (ref 11.5–15.5)
WBC: 6.5 10*3/uL (ref 4.0–10.5)
nRBC: 0 % (ref 0.0–0.2)

## 2020-03-17 LAB — URINALYSIS, ROUTINE W REFLEX MICROSCOPIC
Bilirubin Urine: NEGATIVE
Glucose, UA: NEGATIVE mg/dL
Hgb urine dipstick: NEGATIVE
Ketones, ur: 5 mg/dL — AB
Leukocytes,Ua: NEGATIVE
Nitrite: NEGATIVE
Protein, ur: NEGATIVE mg/dL
Specific Gravity, Urine: 1.012 (ref 1.005–1.030)
pH: 7 (ref 5.0–8.0)

## 2020-03-17 LAB — COMPREHENSIVE METABOLIC PANEL
ALT: 18 U/L (ref 0–44)
AST: 19 U/L (ref 15–41)
Albumin: 3.5 g/dL (ref 3.5–5.0)
Alkaline Phosphatase: 48 U/L (ref 38–126)
Anion gap: 11 (ref 5–15)
BUN: 12 mg/dL (ref 8–23)
CO2: 22 mmol/L (ref 22–32)
Calcium: 8.1 mg/dL — ABNORMAL LOW (ref 8.9–10.3)
Chloride: 101 mmol/L (ref 98–111)
Creatinine, Ser: 0.8 mg/dL (ref 0.61–1.24)
GFR, Estimated: >60 mL/min (ref >60)
Glucose, Bld: 133 mg/dL — ABNORMAL HIGH (ref 70–99)
Potassium: 2.8 mmol/L — ABNORMAL LOW (ref 3.5–5.1)
Sodium: 134 mmol/L — ABNORMAL LOW (ref 135–145)
Total Bilirubin: 0.9 mg/dL (ref 0.3–1.2)
Total Protein: 6 g/dL — ABNORMAL LOW (ref 6.5–8.1)

## 2020-03-17 LAB — TROPONIN I (HIGH SENSITIVITY)
Troponin I (High Sensitivity): 4 ng/L (ref <18)
Troponin I (High Sensitivity): 6 ng/L (ref <18)

## 2020-03-17 LAB — LIPASE, BLOOD: Lipase: 28 U/L (ref 11–51)

## 2020-03-17 LAB — MAGNESIUM: Magnesium: 1.6 mg/dL — ABNORMAL LOW (ref 1.7–2.4)

## 2020-03-17 MED ORDER — POTASSIUM CHLORIDE ER 10 MEQ PO TBCR: 10.0000 meq | EXTENDED_RELEASE_TABLET | Freq: Every day | ORAL | 0 refills | Status: DC

## 2020-03-17 MED ORDER — MAGNESIUM OXIDE 400 (241.3 MG) MG PO TABS
400.0000 mg | ORAL_TABLET | Freq: Once | ORAL | Status: AC
  Administered 2020-03-17: 400 mg via ORAL
  Filled 2020-03-17: qty 1

## 2020-03-17 MED ORDER — MAGNESIUM OXIDE 400 MG PO TABS
400.0000 mg | ORAL_TABLET | Freq: Every day | ORAL | 0 refills | Status: DC
Start: 2020-03-17 — End: 2021-06-01

## 2020-03-17 MED ORDER — POTASSIUM CHLORIDE CRYS ER 20 MEQ PO TBCR
40.0000 meq | EXTENDED_RELEASE_TABLET | Freq: Once | ORAL | Status: AC
  Administered 2020-03-17: 40 meq via ORAL
  Filled 2020-03-17: qty 2

## 2020-03-17 NOTE — Discharge Instructions (Addendum)
You will need to get your potassium and magnesium rechecked by your doctor in about 1 week.

## 2020-03-17 NOTE — ED Triage Notes (Addendum)
BIB [facility] w/ complaints of sudden onset of nausea w/ dizziness and became diaphoretic at rest. Pt had one episode of emesis w/ EMS. Pt denies any chest pain/ SOB. Negative w/ orthostatic v/s w/ EMS. EMS administered 4 mg of zofran in route. AOx4. VSS. NAD at this time.   BP-180/100  HR-  90-100 SpO2- 100  EKG-unremarkable  CBG-137 Resp-20

## 2020-03-17 NOTE — ED Notes (Signed)
Pt ambulated in hallway approximately 50 ft with no issue or assistance.

## 2020-03-17 NOTE — ED Notes (Signed)
Patient verbalizes understanding of discharge instructions. Opportunity for questioning and answers were provided. Arm band removed by staff, patient discharged from ED. 

## 2020-03-17 NOTE — ED Provider Notes (Signed)
[facility] EMERGENCY DEPARTMENT Provider Note   CSN: [id number] Arrival date & time: 03/17/20  1221     History No chief complaint on file.   Jimmy [name] is a 79 y.o. male.  Pt presents to the ED today with not feeling well.  Pt said he was fine this morning.  He went to the grocery store and to the gas station.  He came home and felt weak.  He felt off balance.  He sat down and became nauseous and dizzy.  He denied cp or sob.  He called EMS.  He threw up and felt better.  EMS gave him 4 mg zofran IV and he feels normal now.  His orthostatics were negative for EMS.  Pt never felt like his speech was slurred or that he was weak anywhere.          Past Medical History:  Diagnosis Date  . Depression   . Hyperlipidemia   . Hypertension     Patient Active Problem List   Diagnosis Date Noted  . Obesity 07/19/2015  . Hyperlipidemia 01/04/2011  . Hypertension 01/04/2011  . Depression 01/04/2011  . Erectile dysfunction 01/04/2011  . Gout 01/04/2011    Past Surgical History:  Procedure Laterality Date  . left knee arthroscopy    . TONSILLECTOMY         Family History  Problem Relation Age of Onset  . Stroke Mother   . Hypertension Mother   . Heart disease Father   . Alcohol abuse Father   . Cancer Brother     Social History   Tobacco Use  . Smoking status: Former Smoker    Packs/day: 0.50    Years: 3.00    Pack years: 1.50    Types: Cigarettes    Quit date: 04/23/1968    Years since quitting: 51.9  . Smokeless tobacco: Never Used  Substance Use Topics  . Alcohol use: Yes    Alcohol/week: 24.0 standard drinks    Types: 24 Cans of beer per week    Comment: daily beer  . Drug use: No    Home Medications Prior to Admission medications   Medication Sig Start Date End Date Taking? Authorizing Provider  allopurinol (ZYLOPRIM) 300 MG tablet Take 1 tablet (300 mg total) by mouth daily. 06/08/19   Elby Showers, MD  amLODipine (NORVASC)  5 MG tablet Take 1 tablet (5 mg total) by mouth daily. 12/05/19   Elby Showers, MD  aspirin 81 MG tablet Take 81 mg by mouth daily.      [provider]  Cholecalciferol (VITAMIN D3) 2000 UNITS TABS Take 1 tablet by mouth daily.    [provider]  Fiber CAPS Take 1 tablet by mouth 2 (two) times daily.    [provider]  FLUoxetine (PROZAC) 20 MG capsule TAKE 1 CAPSULE(20 MG) BY MOUTH DAILY 01/04/20   Elby Showers, MD  hydrochlorothiazide (HYDRODIURIL) 25 MG tablet Take 1 tablet (25 mg total) by mouth daily. 07/14/19   Elby Showers, MD  loratadine (CLARITIN) 10 MG tablet Take 10 mg by mouth daily.    [provider]  magnesium oxide (MAG-OX) 400 MG tablet Take 1 tablet (400 mg total) by mouth daily. 03/17/20   Isla Pence, MD  OMEGA 3 1000 MG CAPS Take 1 capsule by mouth daily.    [provider]  potassium chloride (KLOR-CON) 10 MEQ tablet Take 1 tablet (10 mEq total) by mouth daily. 03/17/20  Isla Pence, MD  sildenafil (VIAGRA) 100 MG tablet TAKE ONE TABLET BY MOUTH ONE HOUR BEFORE INTERCOURSE 03/06/20   Elby Showers, MD  simvastatin (ZOCOR) 20 MG tablet TAKE 1 TABLET(20 MG) BY MOUTH AT BEDTIME 01/12/20   Elby Showers, MD  trandolapril (MAVIK) 2 MG tablet TAKE 1 TABLET(2 MG) BY MOUTH DAILY 01/04/20   Elby Showers, MD    Allergies    Patient has no known allergies.  Review of Systems   Review of Systems  Gastrointestinal: Positive for nausea and vomiting.  Neurological: Positive for light-headedness.  All other systems reviewed and are negative.   Physical Exam Updated Vital Signs BP (!) 132/117   Pulse 91   Temp 97.6 F (36.4 C) (Oral)   Resp 17   SpO2 100%   Physical Exam Vitals and nursing note reviewed.  Constitutional:      Appearance: Normal appearance. He is obese.  HENT:     Head: Normocephalic and atraumatic.     Right Ear: External ear normal.     Left Ear: External ear normal.     Nose: Nose normal.       Mouth/Throat:     Mouth: Mucous membranes are moist.     Pharynx: Oropharynx is clear.  Eyes:     Extraocular Movements: Extraocular movements intact.     Conjunctiva/sclera: Conjunctivae normal.     Pupils: Pupils are equal, round, and reactive to light.  Cardiovascular:     Rate and Rhythm: Normal rate and regular rhythm.     Pulses: Normal pulses.     Heart sounds: Normal heart sounds.  Pulmonary:     Effort: Pulmonary effort is normal.     Breath sounds: Normal breath sounds.  Abdominal:     General: Abdomen is flat. Bowel sounds are normal.     Palpations: Abdomen is soft.  Musculoskeletal:        General: Normal range of motion.     Cervical back: Normal range of motion and neck supple.  Skin:    General: Skin is warm.     Capillary Refill: Capillary refill takes less than 2 seconds.  Neurological:     General: No focal deficit present.     Mental Status: He is alert and oriented to person, place, and time.  Psychiatric:        Mood and Affect: Mood normal.        Behavior: Behavior normal.        Thought Content: Thought content normal.        Judgment: Judgment normal.     ED Results / Procedures / Treatments   Labs (all labs ordered are listed, but only abnormal results are displayed) Labs Reviewed  URINALYSIS, ROUTINE W REFLEX MICROSCOPIC - Abnormal; Notable for the following components:      Result Value   Ketones, ur 5 (*)    All other components within normal limits  COMPREHENSIVE METABOLIC PANEL - Abnormal; Notable for the following components:   Sodium 134 (*)    Potassium 2.8 (*)    Glucose, Bld 133 (*)    Calcium 8.1 (*)    Total Protein 6.0 (*)    All other components within normal limits  MAGNESIUM - Abnormal; Notable for the following components:   Magnesium 1.6 (*)    All other components within normal limits  CBC WITH DIFFERENTIAL/PLATELET  LIPASE, BLOOD  TROPONIN I (HIGH SENSITIVITY)  TROPONIN I (HIGH SENSITIVITY)    EKG EKG  Interpretation  Date/Time:  Thursday March 17 2020 12:45:35 EST Ventricular Rate:  90 PR Interval:    QRS Duration: 103 QT Interval:  366 QTC Calculation: 448 R Axis:   -48 Text Interpretation: Sinus rhythm Prolonged PR interval Probable left atrial enlargement Left anterior fascicular block Abnormal R-wave progression, late transition No old tracing to compare Confirmed by Isla Pence [phone]) on 03/17/2020 12:51:03 PM   Radiology CT Head Wo Contrast  Result Date: 03/17/2020 CLINICAL DATA:  Statin onset nausea with dizziness. EXAM: CT HEAD WITHOUT CONTRAST TECHNIQUE: Contiguous axial images were obtained from the base of the skull through the vertex without intravenous contrast. COMPARISON:  No comparison studies available. FINDINGS: Brain: There is no evidence for acute hemorrhage, hydrocephalus, mass lesion, or abnormal extra-axial fluid collection. No definite CT evidence for acute infarction. Diffuse loss of parenchymal volume is consistent with atrophy. Patchy low attenuation in the deep hemispheric and periventricular white matter is nonspecific, but likely reflects chronic microvascular ischemic demyelination. Vascular: No hyperdense vessel or unexpected calcification. Skull: No evidence for fracture. No worrisome lytic or sclerotic lesion. Sinuses/Orbits: The visualized paranasal sinuses and mastoid air cells are clear. Visualized portions of the globes and intraorbital fat are unremarkable. Other: None. IMPRESSION: 1. No acute intracranial abnormality. 2. Atrophy with chronic small vessel white matter ischemic disease. Electronically Signed   By: Misty Stanley M.D.   On: 03/17/2020 13:48    Procedures Procedures (including critical care time)  Medications Ordered in ED Medications  magnesium oxide (MAG-OX) tablet 400 mg (has no administration in time range)  potassium chloride SA (KLOR-CON) CR tablet 40 mEq (40 mEq Oral Given 03/17/20 1534)    ED Course  I have reviewed  the triage vital signs and the nursing notes.  Pertinent labs & imaging results that were available during my care of the patient were reviewed by me and considered in my medical decision making (see chart for details).    MDM Rules/Calculators/A&P                          Pt feels much better.  His K and Mg were low and were replaced.  Pt will be put on a short course of Mg and K.  He is able to ambulate without problems.  He is to return if worse and to get his electrolytes rechecked by his doctor.  Final Clinical Impression(s) / ED Diagnoses Final diagnoses:  Hypokalemia  Hypomagnesemia  Non-intractable vomiting with nausea, unspecified vomiting type    Rx / DC Orders ED Discharge Orders         Ordered    potassium chloride (KLOR-CON) 10 MEQ tablet  Daily        03/17/20 1611    magnesium oxide (MAG-OX) 400 MG tablet  Daily        03/17/20 1611           Isla Pence, MD 03/17/20 1614

## 2020-03-21 ENCOUNTER — Telehealth: Payer: Self-pay | Admitting: Internal Medicine

## 2020-03-21 NOTE — Telephone Encounter (Signed)
Thursday

## 2020-03-21 NOTE — Telephone Encounter (Signed)
Done

## 2020-03-21 NOTE — Telephone Encounter (Signed)
Pt said he had to go to the hospital last Thursday because low potassium and low magnesium and they wanted him to follow up with his pcp sometime this week. When would you like him to come in?

## 2020-03-25 ENCOUNTER — Other Ambulatory Visit: Payer: Self-pay

## 2020-03-25 ENCOUNTER — Ambulatory Visit (INDEPENDENT_AMBULATORY_CARE_PROVIDER_SITE_OTHER): Payer: Medicare Other | Admitting: Internal Medicine

## 2020-03-25 ENCOUNTER — Encounter: Payer: Self-pay | Admitting: Internal Medicine

## 2020-03-25 VITALS — BP 130/80 | HR 76 | Temp 98.1°F | Ht 67.0 in | Wt 229.0 lb

## 2020-03-25 DIAGNOSIS — H5500 Unspecified nystagmus: Secondary | ICD-10-CM

## 2020-03-25 DIAGNOSIS — I10 Essential (primary) hypertension: Secondary | ICD-10-CM

## 2020-03-25 DIAGNOSIS — E876 Hypokalemia: Secondary | ICD-10-CM

## 2020-03-25 DIAGNOSIS — H8110 Benign paroxysmal vertigo, unspecified ear: Secondary | ICD-10-CM

## 2020-03-25 LAB — BASIC METABOLIC PANEL
BUN: 11 mg/dL (ref 7–25)
CO2: 30 mmol/L (ref 20–32)
Calcium: 9.7 mg/dL (ref 8.6–10.3)
Chloride: 98 mmol/L (ref 98–110)
Creat: 0.92 mg/dL (ref 0.70–1.18)
Glucose, Bld: 81 mg/dL (ref 65–99)
Potassium: 4.5 mmol/L (ref 3.5–5.3)
Sodium: 136 mmol/L (ref 135–146)

## 2020-03-25 MED ORDER — AMOXICILLIN 500 MG PO CAPS: 500.0000 mg | ORAL_CAPSULE | Freq: Three times a day (TID) | ORAL | 0 refills | Status: DC

## 2020-03-28 ENCOUNTER — Other Ambulatory Visit: Payer: Self-pay

## 2020-03-28 NOTE — Telephone Encounter (Signed)
I think at his age and current meds he likely does not need it. Try staying off of it and have B-met only in a month. His potassium has always been fine before.

## 2020-03-28 NOTE — Telephone Encounter (Signed)
He wants to know if he needs to stay on potassium?

## 2020-03-30 DIAGNOSIS — R269 Unspecified abnormalities of gait and mobility: Secondary | ICD-10-CM | POA: Diagnosis not present

## 2020-03-30 DIAGNOSIS — M545 Low back pain, unspecified: Secondary | ICD-10-CM | POA: Diagnosis not present

## 2020-04-04 NOTE — Telephone Encounter (Signed)
He does not need this

## 2020-04-11 ENCOUNTER — Encounter: Payer: Self-pay | Admitting: Internal Medicine

## 2020-04-11 ENCOUNTER — Other Ambulatory Visit: Payer: Self-pay

## 2020-04-11 ENCOUNTER — Ambulatory Visit (INDEPENDENT_AMBULATORY_CARE_PROVIDER_SITE_OTHER): Payer: Medicare Other | Admitting: Internal Medicine

## 2020-04-11 VITALS — BP 120/80 | HR 69 | Ht 67.0 in | Wt 230.0 lb

## 2020-04-11 DIAGNOSIS — E7800 Pure hypercholesterolemia, unspecified: Secondary | ICD-10-CM

## 2020-04-11 DIAGNOSIS — I10 Essential (primary) hypertension: Secondary | ICD-10-CM

## 2020-04-11 DIAGNOSIS — R42 Dizziness and giddiness: Secondary | ICD-10-CM | POA: Diagnosis not present

## 2020-04-11 MED ORDER — ALPRAZOLAM 0.5 MG PO TABS: ORAL_TABLET | ORAL | 0 refills | Status: DC

## 2020-04-11 NOTE — Patient Instructions (Signed)
Basic metabolic panel drawn in follow-up of blood potassium.  Nystagmus noted on physical exam consistent with vertigo.  Should resolve in a few days.  Patient feeling much better at the present time.  Follow-up December 20 here.  CT of the brain showed no evidence of acute CVA.

## 2020-04-11 NOTE — Progress Notes (Addendum)
   Subjective:    Patient ID: Jimmy [name], male    DOB: 01-21-1941, 79 y.o.   MRN: [id number]  HPI 79 year old Male was seen in the Emergency Department on November 25.  Patient  came home, and felt acutely weak weak and was off balance.  Became nauseated and dizzy.  He vomited and felt better.  EMS was called.  He was given IV Zofran and transferred to the emergency department.  Was not noted to have speech difficulties or significant weakness.  Longstanding history of hypertension which is well controlled.  EKG in the emergency department showed no ectopy but he was noted to have a left anterior fascicular block with no old tracings to compare.  Potassium was low at 2.8 and his sodium was slightly low at 134.  His glucose was normal.  CT of the brain showed no stroke or mass lesions.  Patient had chronic small vessel white matter ischemic disease.  Magnesium was 1.6.  He was given IV magnesium and IV fluids.  He was seen by emergency department physician and thought to be stable for discharge.  Now here for follow-up.  Basic metabolic panel drawn and is within normal limits.  He is now feeling better.    Review of Systems see above-no headache or facial weakness.  No extremity weakness.     Objective:   Physical Exam  Vital signs reviewed.  Blood pressure normal.  Skin warm and dry.  PERRLA.  Extraocular movements are full.  He has nystagmus on extraocular movement testing.  No focal deficits otherwise.  Muscle strength is normal in upper and lower extremities.  No facial weakness noted.  No dysarthria.  Alert and oriented x3.      Assessment & Plan:  Nystagmus consistent with acute vertigo  Essential hypertension-stable on current regimen  Hypomagnesemia-treated in emergency department with IV replacement  Hypokalemia-treated in the emergency department with IV supplementation and potassium is now normal at 4.5 and was 2.8 in the emergency department  These metabolic issues of  hypokalemia and hypomagnesemia were likely due to vomiting   Plan: Patient reassured and he will be reevaluated in late December.  If he has further episodes in the meantime he will contact me.

## 2020-04-11 NOTE — Progress Notes (Addendum)
° °  Subjective:    Patient ID: Jimmy [name], male    DOB: 02-27-1941, 79 y.o.   MRN: [id number]  HPI 79 year old Male seen in the emergency department on November 25.  He came home and felt weak and was off balance.  Became nauseated and dizzy.  He vomited and felt better.  EMS was called.  He was given Zofran IV.  He was sent to the emergency department and was not noted to have speech difficulties or significant weakness.  He has a longstanding history of hypertension which is well controlled.  EKG showed no ectopy.  He had a left anterior fascicular block with no old tracings to compare.  However his potassium was low at 2.8 and his sodium was slightly low at 134.  His glucose was normal.  CT of the brain showed no stroke or mass lesion.  Patient had chronic small vessel white matter ischemic disease.  His magnesium was low at 1.6.  He was treated with IV magnesium and IV fluids.  He was seen.  Follow-up on December 3.  Basic metabolic panel at that time was normal and he was feeling much better.  However he was noted to have nystagmus with extraocular movement testing consistent with vertigo.  He is now here for follow-up.    Review of Systems see above day she has had no further recurrences of vertigo.     Objective:   Physical Exam Blood pressure 120/80 pulse 69 pulse oximetry 97% weight 230 pounds height 5 feet 7 inches BMI 36.02.    Extraocular movements are full without nystagmus.  PERRLA.  Brief neurological exam shows no focal deficits.  He is alert and oriented x3.  Affect thought and judgment are normal.       Assessment & Plan:  He had nystagmus at last visit consistent with vertigo.  This has resolved on today's exam.  Today, I provided him with a prescription for Xanax to have on hand should vertigo recur and he is to keep it with him at all times.  Essential hypertension-blood pressure is under excellent control on current regimen  Hyperlipidemia-he was due for 49-month  recheck in the near future but I think he can just come in and get a fasting lipid panel and liver functions in January without office visit.  We will schedule his physical exam for July 2022.  Health maintenance: He has had 3 Moderna vaccines and a high-dose flu vaccine.  He has had 2 pneumococcal vaccines.  His tetanus immunization is up-to-date.

## 2020-04-11 NOTE — Patient Instructions (Signed)
Return in January for lipid panel and liver functions without office visit.  Take Xanax as directed up to 3 times a day if vertigo recurs.  Continue current medications.  Physical exam scheduled for July 2022.  It was a pleasure to see you today.

## 2020-04-27 DIAGNOSIS — M545 Low back pain, unspecified: Secondary | ICD-10-CM | POA: Diagnosis not present

## 2020-04-27 DIAGNOSIS — R269 Unspecified abnormalities of gait and mobility: Secondary | ICD-10-CM | POA: Diagnosis not present

## 2020-05-09 ENCOUNTER — Other Ambulatory Visit: Payer: Medicare Other | Admitting: Internal Medicine

## 2020-05-09 DIAGNOSIS — I10 Essential (primary) hypertension: Secondary | ICD-10-CM

## 2020-05-09 DIAGNOSIS — H8110 Benign paroxysmal vertigo, unspecified ear: Secondary | ICD-10-CM

## 2020-05-09 DIAGNOSIS — Z0000 Encounter for general adult medical examination without abnormal findings: Secondary | ICD-10-CM

## 2020-05-09 DIAGNOSIS — R42 Dizziness and giddiness: Secondary | ICD-10-CM

## 2020-05-09 DIAGNOSIS — E7800 Pure hypercholesterolemia, unspecified: Secondary | ICD-10-CM

## 2020-05-10 ENCOUNTER — Ambulatory Visit: Payer: Medicare Other | Admitting: Internal Medicine

## 2020-05-12 ENCOUNTER — Other Ambulatory Visit: Payer: Self-pay

## 2020-05-12 ENCOUNTER — Other Ambulatory Visit: Payer: Medicare Other | Admitting: Internal Medicine

## 2020-05-12 DIAGNOSIS — E7800 Pure hypercholesterolemia, unspecified: Secondary | ICD-10-CM | POA: Diagnosis not present

## 2020-05-12 DIAGNOSIS — I10 Essential (primary) hypertension: Secondary | ICD-10-CM | POA: Diagnosis not present

## 2020-05-12 DIAGNOSIS — R42 Dizziness and giddiness: Secondary | ICD-10-CM

## 2020-05-13 LAB — HEPATIC FUNCTION PANEL
AG Ratio: 1.8 (calc) (ref 1.0–2.5)
ALT: 20 U/L (ref 9–46)
AST: 19 U/L (ref 10–35)
Albumin: 4.3 g/dL (ref 3.6–5.1)
Alkaline phosphatase (APISO): 59 U/L (ref 35–144)
Bilirubin, Direct: 0.2 mg/dL (ref 0.0–0.2)
Globulin: 2.4 g/dL (calc) (ref 1.9–3.7)
Indirect Bilirubin: 0.4 mg/dL (calc) (ref 0.2–1.2)
Total Bilirubin: 0.6 mg/dL (ref 0.2–1.2)
Total Protein: 6.7 g/dL (ref 6.1–8.1)

## 2020-05-13 LAB — BASIC METABOLIC PANEL
BUN: 20 mg/dL (ref 7–25)
CO2: 31 mmol/L (ref 20–32)
Calcium: 9.5 mg/dL (ref 8.6–10.3)
Chloride: 99 mmol/L (ref 98–110)
Creat: 1.05 mg/dL (ref 0.70–1.18)
Glucose, Bld: 100 mg/dL — ABNORMAL HIGH (ref 65–99)
Potassium: 4.8 mmol/L (ref 3.5–5.3)
Sodium: 138 mmol/L (ref 135–146)

## 2020-05-13 LAB — LIPID PANEL
Cholesterol: 165 mg/dL (ref <200)
HDL: 62 mg/dL (ref >=40)
LDL Cholesterol (Calc): 88 mg/dL (calc)
Non-HDL Cholesterol (Calc): 103 mg/dL (calc) (ref <130)
Total CHOL/HDL Ratio: 2.7 (calc) (ref <5.0)
Triglycerides: 66 mg/dL (ref <150)

## 2020-05-13 LAB — MAGNESIUM: Magnesium: 2.2 mg/dL (ref 1.5–2.5)

## 2020-05-25 DIAGNOSIS — M545 Low back pain, unspecified: Secondary | ICD-10-CM | POA: Diagnosis not present

## 2020-05-25 DIAGNOSIS — R269 Unspecified abnormalities of gait and mobility: Secondary | ICD-10-CM | POA: Diagnosis not present

## 2020-06-08 ENCOUNTER — Encounter: Payer: Self-pay | Admitting: Podiatry

## 2020-06-08 ENCOUNTER — Ambulatory Visit: Payer: Medicare Other | Admitting: Podiatry

## 2020-06-08 ENCOUNTER — Other Ambulatory Visit: Payer: Self-pay

## 2020-06-08 DIAGNOSIS — L84 Corns and callosities: Secondary | ICD-10-CM

## 2020-06-08 DIAGNOSIS — M2041 Other hammer toe(s) (acquired), right foot: Secondary | ICD-10-CM

## 2020-06-08 NOTE — Progress Notes (Signed)
This patient presents the office with chief complaint of a painful corn on his fifth toe right foot patient's states that this corn comes back and causes pain and discomfort walking and wearing his shoes.  He was last seen by Dr. Paulla Dolly in March 2021.  He says that his toe feels good afterwards worked on.  He presents the office today for treatment of his painful corn.  Vascular  Dorsalis pedis and posterior tibial pulses are palpable  B/L.  Capillary return  WNL.  Temperature gradient is  WNL.  Skin turgor  WNL  Sensorium  Senn Weinstein monofilament wire  WNL. Normal tactile sensation.  Nail Exam  Patient has normal nails with no evidence of bacterial or fungal infection.  Orthopedic  Exam  Muscle tone and muscle strength  WNL.  No limitations of motion feet  B/L.  No crepitus or joint effusion noted.  Hammer toe fifth toe right foot..  Bony prominences are unremarkable.  Skin  No open lesions.  Normal skin texture and turgor.  Heloma durum fifth toe right foot.  Heloma durum fifth toe secondary to hammer toe fifth right.  Debride  HD with # 15 blade. Told to use padding as needed.  RTC prn.   Jimmy [name] DPM

## 2020-06-22 DIAGNOSIS — R269 Unspecified abnormalities of gait and mobility: Secondary | ICD-10-CM | POA: Diagnosis not present

## 2020-06-22 DIAGNOSIS — M545 Low back pain, unspecified: Secondary | ICD-10-CM | POA: Diagnosis not present

## 2020-07-11 ENCOUNTER — Other Ambulatory Visit: Payer: Self-pay | Admitting: Internal Medicine

## 2020-07-19 DIAGNOSIS — H02831 Dermatochalasis of right upper eyelid: Secondary | ICD-10-CM | POA: Diagnosis not present

## 2020-07-19 DIAGNOSIS — H02834 Dermatochalasis of left upper eyelid: Secondary | ICD-10-CM | POA: Diagnosis not present

## 2020-07-19 DIAGNOSIS — H25813 Combined forms of age-related cataract, bilateral: Secondary | ICD-10-CM | POA: Diagnosis not present

## 2020-08-17 DIAGNOSIS — R269 Unspecified abnormalities of gait and mobility: Secondary | ICD-10-CM | POA: Diagnosis not present

## 2020-08-17 DIAGNOSIS — M545 Low back pain, unspecified: Secondary | ICD-10-CM | POA: Diagnosis not present

## 2020-09-13 DIAGNOSIS — M545 Low back pain, unspecified: Secondary | ICD-10-CM | POA: Diagnosis not present

## 2020-09-13 DIAGNOSIS — R269 Unspecified abnormalities of gait and mobility: Secondary | ICD-10-CM | POA: Diagnosis not present

## 2020-10-09 ENCOUNTER — Other Ambulatory Visit: Payer: Self-pay | Admitting: Internal Medicine

## 2020-10-11 DIAGNOSIS — R269 Unspecified abnormalities of gait and mobility: Secondary | ICD-10-CM | POA: Diagnosis not present

## 2020-10-11 DIAGNOSIS — M545 Low back pain, unspecified: Secondary | ICD-10-CM | POA: Diagnosis not present

## 2020-11-01 ENCOUNTER — Other Ambulatory Visit: Payer: Self-pay

## 2020-11-01 ENCOUNTER — Other Ambulatory Visit: Payer: Medicare Other | Admitting: Internal Medicine

## 2020-11-01 DIAGNOSIS — E785 Hyperlipidemia, unspecified: Secondary | ICD-10-CM | POA: Diagnosis not present

## 2020-11-01 DIAGNOSIS — E7800 Pure hypercholesterolemia, unspecified: Secondary | ICD-10-CM

## 2020-11-01 DIAGNOSIS — I10 Essential (primary) hypertension: Secondary | ICD-10-CM

## 2020-11-01 DIAGNOSIS — H8110 Benign paroxysmal vertigo, unspecified ear: Secondary | ICD-10-CM

## 2020-11-01 DIAGNOSIS — Z0000 Encounter for general adult medical examination without abnormal findings: Secondary | ICD-10-CM

## 2020-11-02 LAB — CBC WITH DIFFERENTIAL/PLATELET
Absolute Monocytes: 511 cells/uL (ref 200–950)
Basophils Absolute: 39 cells/uL (ref 0–200)
Basophils Relative: 1 %
Eosinophils Absolute: 121 cells/uL (ref 15–500)
Eosinophils Relative: 3.1 %
HCT: 43.9 % (ref 38.5–50.0)
Hemoglobin: 15.1 g/dL (ref 13.2–17.1)
Lymphs Abs: 1182 cells/uL (ref 850–3900)
MCH: 33.3 pg — ABNORMAL HIGH (ref 27.0–33.0)
MCHC: 34.4 g/dL (ref 32.0–36.0)
MCV: 96.7 fL (ref 80.0–100.0)
MPV: 10.1 fL (ref 7.5–12.5)
Monocytes Relative: 13.1 %
Neutro Abs: 2048 cells/uL (ref 1500–7800)
Neutrophils Relative %: 52.5 %
Platelets: 183 10*3/uL (ref 140–400)
RBC: 4.54 10*6/uL (ref 4.20–5.80)
RDW: 12.9 % (ref 11.0–15.0)
Total Lymphocyte: 30.3 %
WBC: 3.9 10*3/uL (ref 3.8–10.8)

## 2020-11-02 LAB — PSA: PSA: 0.57 ng/mL (ref <=4.00)

## 2020-11-02 LAB — COMPLETE METABOLIC PANEL WITH GFR
AG Ratio: 1.8 (calc) (ref 1.0–2.5)
ALT: 17 U/L (ref 9–46)
AST: 16 U/L (ref 10–35)
Albumin: 4.4 g/dL (ref 3.6–5.1)
Alkaline phosphatase (APISO): 70 U/L (ref 35–144)
BUN: 14 mg/dL (ref 7–25)
CO2: 28 mmol/L (ref 20–32)
Calcium: 9.3 mg/dL (ref 8.6–10.3)
Chloride: 97 mmol/L — ABNORMAL LOW (ref 98–110)
Creat: 0.94 mg/dL (ref 0.70–1.28)
Globulin: 2.5 g/dL (calc) (ref 1.9–3.7)
Glucose, Bld: 100 mg/dL — ABNORMAL HIGH (ref 65–99)
Potassium: 3.9 mmol/L (ref 3.5–5.3)
Sodium: 134 mmol/L — ABNORMAL LOW (ref 135–146)
Total Bilirubin: 0.7 mg/dL (ref 0.2–1.2)
Total Protein: 6.9 g/dL (ref 6.1–8.1)
eGFR: 83 mL/min/{1.73_m2} (ref >=60)

## 2020-11-02 LAB — LIPID PANEL
Cholesterol: 158 mg/dL (ref <200)
HDL: 64 mg/dL (ref >=40)
LDL Cholesterol (Calc): 72 mg/dL (calc)
Non-HDL Cholesterol (Calc): 94 mg/dL (calc) (ref <130)
Total CHOL/HDL Ratio: 2.5 (calc) (ref <5.0)
Triglycerides: 141 mg/dL (ref <150)

## 2020-11-07 ENCOUNTER — Other Ambulatory Visit: Payer: Self-pay

## 2020-11-07 ENCOUNTER — Encounter: Payer: Self-pay | Admitting: Internal Medicine

## 2020-11-07 ENCOUNTER — Ambulatory Visit (INDEPENDENT_AMBULATORY_CARE_PROVIDER_SITE_OTHER): Payer: Medicare Other | Admitting: Internal Medicine

## 2020-11-07 VITALS — BP 120/70 | HR 76 | Ht 67.0 in | Wt 226.0 lb

## 2020-11-07 DIAGNOSIS — E7800 Pure hypercholesterolemia, unspecified: Secondary | ICD-10-CM

## 2020-11-07 DIAGNOSIS — Z6835 Body mass index (BMI) 35.0-35.9, adult: Secondary | ICD-10-CM

## 2020-11-07 DIAGNOSIS — Z0000 Encounter for general adult medical examination without abnormal findings: Secondary | ICD-10-CM

## 2020-11-07 DIAGNOSIS — Z87898 Personal history of other specified conditions: Secondary | ICD-10-CM

## 2020-11-07 DIAGNOSIS — I10 Essential (primary) hypertension: Secondary | ICD-10-CM | POA: Diagnosis not present

## 2020-11-07 DIAGNOSIS — Z8739 Personal history of other diseases of the musculoskeletal system and connective tissue: Secondary | ICD-10-CM | POA: Diagnosis not present

## 2020-11-07 LAB — POCT URINALYSIS DIPSTICK
Appearance: NEGATIVE
Bilirubin, UA: NEGATIVE
Blood, UA: NEGATIVE
Glucose, UA: NEGATIVE
Ketones, UA: NEGATIVE
Leukocytes, UA: NEGATIVE
Nitrite, UA: NEGATIVE
Odor: NEGATIVE
Protein, UA: NEGATIVE
Spec Grav, UA: 1.01 (ref 1.010–1.025)
Urobilinogen, UA: 0.2 E.U./dL
pH, UA: 6 (ref 5.0–8.0)

## 2020-11-07 NOTE — Patient Instructions (Signed)
It was a pleasure to see you today. RTC in 6 months. Weight loss recommended.

## 2020-11-07 NOTE — Progress Notes (Signed)
Subjective:    Patient ID: Jimmy [name], male    DOB: [birth year], 80 y.o.   MRN: [id number]  HPI 80 year old Male for Medicare wellness, health maintenance exam, and evaluation of medical issues.  Patient has a history of hypertension well-controlled on current regimen of Mavik 2 mg daily, HCTZ 25 mg daily and amlodipine 5 mg daily.  His weight was 212 pounds in May 2020 and is now 226 pounds.  Would like to see him lose some weight.  BMI is currently 35.40  He continues to work.  He does some fishing and takes an overnight trips from time to time.  He likes to travel to the mountains sometimes.  He has a history of gout and hyperlipidemia.  In January 2021 he tested positive for COVID-19 but only had mild symptoms.  Had colonoscopy by Dr. Delfin Edis in 2014 which was normal and 10-year follow-up recommended.  He [facility] for [facility] exam.  Our records indicate he has had 3 COVID immunizations all in 2021.  Tetanus immunization is up-to-date.  Pneumococcal vaccines up-to-date.  Gets annual flu vaccine.  Family history: Father with history of coronary artery disease, diabetes, gout status post CABG deceased with an acute coronary event.  Father had history of gout and alcohol abuse.  Mother with history of stroke and hypertension.  She died suddenly with an acute arrest- perhaps stroke or MI . One sister died in a motor vehicle accident.  2 brothers-one of them has history of lymphoma but has been doing well.  2 sisters in good health.  Social history: He is divorced.  Does not smoke.  Social alcohol consumption.  He has adult children.  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  Colonoscopy due 2024. Feels well with no new complaints.  Review of Systems-had episode of vertigo in November 2021.  Went to the Emergency Department and received IV fluids and magnesium supplementation.     Objective:   Physical Exam Blood pressure 120/70 pulse 76 pulse oximetry  97% weight 226 pounds BMI 35.40  Skin: Warm and dry.  No cervical adenopathy.  No JVD.  No thyromegaly.  No carotid bruits.  Chest is clear to auscultation.  Cardiac exam: Regular rate and rhythm without ectopy or murmur.  Abdomen is soft nondistended without hepatosplenomegaly masses or tenderness.  Prostate is normal without nodules.  No lower extremity pitting edema.  Affect thought and judgment are normal.  Cranial nerves II through XII grossly intact without focal deficits.       Assessment & Plan:  Essential hypertension well-controlled on current regimen of trandolapril, HCTZ and Norvasc  BMI 35.40-continue to work on diet exercise and weight loss  History of gout-treated with allopurinol  History of COVID-19-has had 3 Moderna vaccine in 2021  Health maintenance-colonoscopy is up-to-date as is tetanus immunization and pneumococcal vaccines.  History of vertigo treated with Xanax on an as-needed basis  Erectile dysfunction treated with Viagra as needed  History of anxiety/depression treated with Prozac  Plan: Return in 6 months for office visit, blood pressure check, lipid panel and liver functions.  Recommend COVID booster in the Fall.  Colonoscopy is up-to-date.  PSA is within normal limits.   Subjective:   Patient presents for Medicare Annual/Subsequent preventive examination.  Review Past Medical/Family/Social: See above  Risk Factors  Current exercise habits: Light Dietary issues discussed: Yes  Cardiac risk factors: Hyperlipidemia and family history  Depression Screen  (Note: if answer to  either of the following is "Yes", a more complete depression screening is indicated)   Over the past two weeks, have you felt down, depressed or hopeless? No  Over the past two weeks, have you felt little interest or pleasure in doing things? No Have you lost interest or pleasure in daily life? No Do you often feel hopeless? No Do you cry easily over simple problems? No    Activities of Daily Living  In your present state of health, do you have any difficulty performing the following activities?:   Driving? No  Managing money? No  Feeding yourself? No  Getting from bed to chair? No  Climbing a flight of stairs? No  Preparing food and eating?: No  Bathing or showering? No  Getting dressed: No  Getting to the toilet? No  Using the toilet:No  Moving around from place to place: No  In the past year have you fallen or had a near fall?:No  Are you sexually active? yes Do you have more than one partner? No   Hearing Difficulties: No  Do you often ask people to speak up or repeat themselves? No  Do you experience ringing or noises in your ears? No  Do you have difficulty understanding soft or whispered voices? No  Do you feel that you have a problem with memory? No Do you often misplace items? No    Home Safety:  Do you have a smoke alarm at your residence? Yes Do you have grab bars in the bathroom?  Yes Do you have throw rugs in your house?  No   Cognitive Testing  Alert? Yes Normal Appearance?Yes  Oriented to person? Yes Place? Yes  Time? Yes  Recall of three objects? Yes  Can perform simple calculations? Yes  Displays appropriate judgment?Yes  Can read the correct time from a watch face?Yes   List the Names of Other Physician/Practitioners you currently use:  See referral list for the physicians patient is currently seeing.     Review of Systems: See above   Objective:     General appearance: Appears younger than stated age and mildly obese  Head: Normocephalic, without obvious abnormality, atraumatic  Eyes: conj clear, EOMi PEERLA  Ears: normal TM's and external ear canals both ears  Nose: Nares normal. Septum midline. Mucosa normal. No drainage or sinus tenderness.  Throat: lips, mucosa, and tongue normal; teeth and gums normal  Neck: no adenopathy, no carotid bruit, no JVD, supple, symmetrical, trachea midline and thyroid not  enlarged, symmetric, no tenderness/mass/nodules  No CVA tenderness.  Lungs: clear to auscultation bilaterally  Breasts: normal appearance, no masses or tenderness Heart: regular rate and rhythm, S1, S2 normal, no murmur, click, rub or gallop  Abdomen: soft, non-tender; bowel sounds normal; no masses, no organomegaly  Musculoskeletal: ROM normal in all joints, no crepitus, no deformity, Normal muscle strengthen. Back  is symmetric, no curvature. Skin: Skin color, texture, turgor normal. No rashes or lesions  Lymph nodes: Cervical, supraclavicular, and axillary nodes normal.  Neurologic: CN 2 -12 Normal, Normal symmetric reflexes. Normal coordination and gait  Psych: Alert & Oriented x 3, Mood appear stable.    Assessment:    Annual wellness medicare exam   Plan:    During the course of the visit the patient was educated and counseled about appropriate screening and preventive services including:   Recommend COVID booster in the fall  Tetanus immunization is up-to-date  Colonoscopy is up-to-date     Patient Instructions (the written plan)  was given to the patient.  Medicare Attestation  I have personally reviewed:  The patient's medical and social history  Their use of alcohol, tobacco or illicit drugs  Their current medications and supplements  The patient's functional ability including ADLs,fall risks, home safety risks, cognitive, and hearing and visual impairment  Diet and physical activities  Evidence for depression or mood disorders  The patient's weight, height, BMI, and visual acuity have been recorded in the chart. I have made referrals, counseling, and provided education to the patient based on review of the above and I have provided the patient with a written personalized care plan for preventive services.

## 2020-11-08 DIAGNOSIS — M545 Low back pain, unspecified: Secondary | ICD-10-CM | POA: Diagnosis not present

## 2020-11-08 DIAGNOSIS — R269 Unspecified abnormalities of gait and mobility: Secondary | ICD-10-CM | POA: Diagnosis not present

## 2020-11-29 DIAGNOSIS — R269 Unspecified abnormalities of gait and mobility: Secondary | ICD-10-CM | POA: Diagnosis not present

## 2020-11-29 DIAGNOSIS — M545 Low back pain, unspecified: Secondary | ICD-10-CM | POA: Diagnosis not present

## 2020-12-11 ENCOUNTER — Other Ambulatory Visit: Payer: Self-pay | Admitting: Internal Medicine

## 2020-12-15 ENCOUNTER — Ambulatory Visit (INDEPENDENT_AMBULATORY_CARE_PROVIDER_SITE_OTHER): Payer: Medicare Other | Admitting: Internal Medicine

## 2020-12-15 ENCOUNTER — Telehealth: Payer: Self-pay | Admitting: Internal Medicine

## 2020-12-15 ENCOUNTER — Other Ambulatory Visit: Payer: Self-pay

## 2020-12-15 VITALS — BP 130/70 | HR 78 | Ht 67.0 in | Wt 230.0 lb

## 2020-12-15 DIAGNOSIS — H8112 Benign paroxysmal vertigo, left ear: Secondary | ICD-10-CM

## 2020-12-15 DIAGNOSIS — H5500 Unspecified nystagmus: Secondary | ICD-10-CM

## 2020-12-15 DIAGNOSIS — I10 Essential (primary) hypertension: Secondary | ICD-10-CM | POA: Diagnosis not present

## 2020-12-15 DIAGNOSIS — H6502 Acute serous otitis media, left ear: Secondary | ICD-10-CM

## 2020-12-15 MED ORDER — AMOXICILLIN 500 MG PO CAPS: 500.0000 mg | ORAL_CAPSULE | Freq: Three times a day (TID) | ORAL | 0 refills | Status: AC

## 2020-12-15 NOTE — Telephone Encounter (Signed)
Jo Oquinn [phone]  Jimmy [name] called to say he has had vertigo or fluid in his ears or maybe congestion in his ears since Tuesday and can not get it cleared up, he has tried alprazolam that you gave him in the past, but it has not helped. He would like to come in for you to look in his ears.

## 2020-12-15 NOTE — Telephone Encounter (Signed)
scheduled

## 2020-12-15 NOTE — Progress Notes (Signed)
   Subjective:    Patient ID: Jimmy [name], male    DOB: Aug 28, 1940, 80 y.o.   MRN: [id number]  HPI 80 year old Male seen for vertigo for 9 days. Had been swimming about a week prior. Took Xanax with some improvement but vertigo has not completely resolved. Feels that there is some ear congestion. He has tried Alprazolam but still having symptoms and requested appointment to be seen and have ears checked.  He has hx of well controlled HTN. Had vertigo December 2021 requiring ED evaluation. Magnesium was low but had vomited prior to presentation to the emergency department.  CT brain at the time showed no stroke.  EKG in the emergency department showed no ectopy but he was noted to have a left anterior fascicular block with no old tracings to compare.  Potassium was low at 2.8 and sodium was slightly low at 134 but glucose was normal.    Review of Systems see above.  No visual disturbance or chest pain.  No headache.     Objective:   Physical Exam Blood pressure excellent at 130/70 pulse 78 regular pulse oximetry 97% weight 230 pounds BMI 36.02  PERRLA.  Extraocular movements are full.  Has slight nystagmus.  Cranial nerves II through XII are grossly intact.  Muscle strength and deep tendon reflexes are within normal limits.  Nystagmus is present.  TMs are slightly dull bilaterally.       Assessment & Plan:  Benign positional vertigo-recurrent  Essential hypertension-excellent control at the present time  Serous otitis media  Plan: Stay well-hydrated.  Avoid sudden head movements.  Continue Xanax-take this 3 times daily 1/2-1 tab at a time.  Call if not better in 10 days or sooner if worse.  Take Amoxicillin 500 mg 3 times a day for serous otitis media/ear stuffiness

## 2020-12-15 NOTE — Patient Instructions (Addendum)
Continue Xanx 3 times daily- may take one half tab if it makes you too drowsy.Avoid sudden head movements which will trigger inner ear to react with dizziness as a result. Stay well hydrated. Call in not better in 10 days or sooner if worse.  Take Amoxicillin 500 mg 3 times a day for serous otitis media

## 2020-12-17 ENCOUNTER — Encounter: Payer: Self-pay | Admitting: Internal Medicine

## 2020-12-28 DIAGNOSIS — M545 Low back pain, unspecified: Secondary | ICD-10-CM | POA: Diagnosis not present

## 2020-12-28 DIAGNOSIS — R269 Unspecified abnormalities of gait and mobility: Secondary | ICD-10-CM | POA: Diagnosis not present

## 2021-01-01 ENCOUNTER — Other Ambulatory Visit: Payer: Self-pay | Admitting: Internal Medicine

## 2021-01-02 ENCOUNTER — Telehealth: Payer: Self-pay

## 2021-01-02 NOTE — Telephone Encounter (Signed)
Patient complaining of ear blockage.  Felt better for a short period after antibiotic.  His balance has been off, been feeling bad for about a 1.5 weeks.  Denies taking xanax this time.  Denies covid exposure.  Please advise.

## 2021-01-02 NOTE — Telephone Encounter (Signed)
Appointment scheduled.

## 2021-01-03 ENCOUNTER — Other Ambulatory Visit: Payer: Self-pay

## 2021-01-03 ENCOUNTER — Ambulatory Visit (INDEPENDENT_AMBULATORY_CARE_PROVIDER_SITE_OTHER): Payer: Medicare Other | Admitting: Internal Medicine

## 2021-01-03 ENCOUNTER — Encounter: Payer: Self-pay | Admitting: Internal Medicine

## 2021-01-03 VITALS — BP 138/88 | HR 79 | Temp 97.5°F | Ht 67.0 in | Wt 232.0 lb

## 2021-01-03 DIAGNOSIS — R42 Dizziness and giddiness: Secondary | ICD-10-CM | POA: Diagnosis not present

## 2021-01-03 DIAGNOSIS — I10 Essential (primary) hypertension: Secondary | ICD-10-CM

## 2021-01-03 DIAGNOSIS — H5500 Unspecified nystagmus: Secondary | ICD-10-CM

## 2021-01-03 MED ORDER — ALPRAZOLAM 0.5 MG PO TABS: ORAL_TABLET | ORAL | 1 refills | Status: AC

## 2021-01-03 NOTE — Progress Notes (Signed)
   Subjective:    Patient ID: Jimmy [name], male    DOB: [birth year], 80 y.o.   MRN: [id number]  HPI 80 year old Male seen August 25 with ear fullness and vertigo treated with Xanax and Amoxicillin.At that time had been swimming for  a week prior to symptom onset. Hx of HTN and hyperlipidemia. At the time he felt like he had some ear congestion.  He sees [facility] for annual eye exam.  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  He had vertigo in December 2021 requiring ED evaluation.  CT of the brain at the time showed no stroke.  EKG showed no ectopy.  Potassium was low at the time of 2.8 and sodium was slightly low at 134.  He had health maintenance exam in July 2022 and his labs were stable.  This included CBC with differential, c-Met, lipid panel and PSA as well as dipstick UA.    Review of Systems no nausea or vomiting.  Says symptoms are not as severe as they were in late August.  However he is planning a fishing trip in the next couple of weeks in the [place].     Objective:   Physical Exam He is afebrile.  Blood pressure 138/88 pulse 79 regular pulse oximetry 97% BMI 36.34  PERRLA extraocular movements are full.  He has very slight rightward nystagmus today that is barely noticeable.  His chest is clear.  Cardiac exam regular rate and rhythm.  No carotid bruits.  Brief neurological exam shows no focal deficits other than a slight rightward nystagmus.       Assessment & Plan:  Persistent/recurrent vertigo  Plan: I would like for him to see ENT physician for consideration of Epley maneuver and further evaluation.  He is agreeable to this.  I have refilled Xanax 0.5 mg 1/2 to 1 tablet up to 3 times daily as needed for vertigo.  He will continue with antihypertensive medication.

## 2021-01-03 NOTE — Patient Instructions (Signed)
Referral to ENT physician for evaluation and consideration of Epley maneuver.  Xanax has been refilled to take sparingly for dizziness.

## 2021-01-04 ENCOUNTER — Other Ambulatory Visit: Payer: Self-pay | Admitting: Internal Medicine

## 2021-01-06 ENCOUNTER — Other Ambulatory Visit: Payer: Self-pay | Admitting: Internal Medicine

## 2021-01-17 DIAGNOSIS — H02831 Dermatochalasis of right upper eyelid: Secondary | ICD-10-CM | POA: Diagnosis not present

## 2021-01-17 DIAGNOSIS — H02834 Dermatochalasis of left upper eyelid: Secondary | ICD-10-CM | POA: Diagnosis not present

## 2021-01-17 DIAGNOSIS — H25813 Combined forms of age-related cataract, bilateral: Secondary | ICD-10-CM | POA: Diagnosis not present

## 2021-01-18 DIAGNOSIS — M545 Low back pain, unspecified: Secondary | ICD-10-CM | POA: Diagnosis not present

## 2021-01-18 DIAGNOSIS — R269 Unspecified abnormalities of gait and mobility: Secondary | ICD-10-CM | POA: Diagnosis not present

## 2021-01-25 ENCOUNTER — Ambulatory Visit (INDEPENDENT_AMBULATORY_CARE_PROVIDER_SITE_OTHER): Payer: Medicare Other | Admitting: Internal Medicine

## 2021-01-25 ENCOUNTER — Other Ambulatory Visit: Payer: Self-pay

## 2021-01-25 DIAGNOSIS — Z23 Encounter for immunization: Secondary | ICD-10-CM

## 2021-01-26 NOTE — Progress Notes (Signed)
Flu vaccine given by CMA 

## 2021-02-17 DIAGNOSIS — R269 Unspecified abnormalities of gait and mobility: Secondary | ICD-10-CM | POA: Diagnosis not present

## 2021-02-17 DIAGNOSIS — M545 Low back pain, unspecified: Secondary | ICD-10-CM | POA: Diagnosis not present

## 2021-03-21 DIAGNOSIS — M545 Low back pain, unspecified: Secondary | ICD-10-CM | POA: Diagnosis not present

## 2021-03-21 DIAGNOSIS — R269 Unspecified abnormalities of gait and mobility: Secondary | ICD-10-CM | POA: Diagnosis not present

## 2021-04-03 ENCOUNTER — Other Ambulatory Visit: Payer: Self-pay | Admitting: Internal Medicine

## 2021-04-10 ENCOUNTER — Other Ambulatory Visit: Payer: Self-pay | Admitting: Internal Medicine

## 2021-04-11 DIAGNOSIS — R269 Unspecified abnormalities of gait and mobility: Secondary | ICD-10-CM | POA: Diagnosis not present

## 2021-04-11 DIAGNOSIS — M545 Low back pain, unspecified: Secondary | ICD-10-CM | POA: Diagnosis not present

## 2021-05-08 ENCOUNTER — Other Ambulatory Visit: Payer: Self-pay

## 2021-05-08 ENCOUNTER — Other Ambulatory Visit: Payer: Medicare Other | Admitting: Internal Medicine

## 2021-05-08 DIAGNOSIS — E7800 Pure hypercholesterolemia, unspecified: Secondary | ICD-10-CM | POA: Diagnosis not present

## 2021-05-08 LAB — LIPID PANEL
Cholesterol: 169 mg/dL (ref <200)
HDL: 59 mg/dL (ref >=40)
LDL Cholesterol (Calc): 87 mg/dL (calc)
Non-HDL Cholesterol (Calc): 110 mg/dL (calc) (ref <130)
Total CHOL/HDL Ratio: 2.9 (calc) (ref <5.0)
Triglycerides: 134 mg/dL (ref <150)

## 2021-05-09 ENCOUNTER — Ambulatory Visit (INDEPENDENT_AMBULATORY_CARE_PROVIDER_SITE_OTHER): Payer: Medicare Other | Admitting: Internal Medicine

## 2021-05-09 ENCOUNTER — Encounter: Payer: Self-pay | Admitting: Internal Medicine

## 2021-05-09 VITALS — BP 132/90 | HR 79 | Temp 97.2°F | Ht 67.0 in | Wt 232.0 lb

## 2021-05-09 DIAGNOSIS — Z8739 Personal history of other diseases of the musculoskeletal system and connective tissue: Secondary | ICD-10-CM

## 2021-05-09 DIAGNOSIS — Z87898 Personal history of other specified conditions: Secondary | ICD-10-CM

## 2021-05-09 DIAGNOSIS — Z8249 Family history of ischemic heart disease and other diseases of the circulatory system: Secondary | ICD-10-CM

## 2021-05-09 DIAGNOSIS — E7800 Pure hypercholesterolemia, unspecified: Secondary | ICD-10-CM | POA: Diagnosis not present

## 2021-05-09 DIAGNOSIS — I10 Essential (primary) hypertension: Secondary | ICD-10-CM

## 2021-05-09 NOTE — Progress Notes (Signed)
° °  Subjective:    Patient ID: Jimmy [name], male    DOB: 1940/08/04, 81 y.o.   MRN: [id number]  HPI 81 year old Male seen today for 6 month follow up. He had health maintenance exam in July 2022.  He has a history of essential hypertension longstanding, history of gout and hyperlipidemia.  He recently had COVID vaccine update May 03, 2021.  He continues to work.  He is Recruitment consultant. He does some fishing and takes overnight trips from time to time.  He likes to travel to the mountains sometimes.    History of COVID-19 in January 2021 but only had mild symptoms.  Social history: He is divorced.  Does not smoke.  Social alcohol consumption.  He has adult children.  Colonoscopy due in 2024.  Family history: Father with history of coronary artery disease, diabetes, gallops status post CABG deceased with an acute coronary event.  Mother with history of stroke and hypertension.  She passed away suddenly when acute arrest perhaps stroke or MI.  1 sister died in motor vehicle accident.  2 brothers-one of them has history of lymphoma but doing well.  2 sisters.  He had an episode of vertigo in November 2021 and went to the emergency department.  He received IV fluids and magnesium supplementation.      Review of Systems no new complaints     Objective:   Physical Exam Blood pressure 132/90 pulse 79 temperature 97.2 degrees pulse oximetry 98% weight 232 pounds BMI 36.34  Skin: Warm and dry.  Chest is clear to auscultation.  No carotid bruits.  Cardiac exam: Regular rate and rhythm without ectopy.  No lower extremity edema.  Lipid panel is within normal limits.  He is on statin medication, simvastatin 20 mg daily       Assessment & Plan:  Family history of coronary disease.  I think he could benefit from coronary calcium scoring and/or other evaluation as deemed necessary by Cardiologist.  He would like to see Dr. Percival Spanish.  Referral will be  made.  History of gout-no recent episodes.  He is on allopurinol.  Essential hypertension-would like cardiologist to review his current regimen and make recommendations.  Currently on amlodipine 5 mg daily and trandolapril 2 mg daily as well as HCTZ 25 mg daily.  BMI 36.34-does not exercise as regularly as he should.  Needs to lose some weight.  Hyperlipidemia stable on statin medication.  Lipid panel is normal.  He is on simvastatin 20 mg daily  For grief reaction and situational stress, he is on Prozac 20 mg daily.  He lost his long-term partner, Eber Hong in 2019 from cardiac complications and history of lung cancer.  Immunizations are up-to-date.  Plan: He will return here for health maintenance exam and Medicare wellness visit in 6 months.  We will request cardiology consultation with Dr. Percival Spanish.

## 2021-05-09 NOTE — Patient Instructions (Addendum)
Patient would like to see Dr. Percival Spanish for Cardiology consult and evaluation with hyperlipidemia and HTN as well as strong Family hx of CV disease in Father.BP slightlyelevated today. He will monitor BP at home. Encourage healthy diet and  regular exercise. Watch alcohol consumption.

## 2021-05-10 DIAGNOSIS — R269 Unspecified abnormalities of gait and mobility: Secondary | ICD-10-CM | POA: Diagnosis not present

## 2021-05-10 DIAGNOSIS — M545 Low back pain, unspecified: Secondary | ICD-10-CM | POA: Diagnosis not present

## 2021-06-01 ENCOUNTER — Other Ambulatory Visit: Payer: Self-pay

## 2021-06-01 ENCOUNTER — Encounter: Payer: Self-pay | Admitting: Internal Medicine

## 2021-06-01 ENCOUNTER — Ambulatory Visit (INDEPENDENT_AMBULATORY_CARE_PROVIDER_SITE_OTHER): Payer: Medicare Other | Admitting: Internal Medicine

## 2021-06-01 ENCOUNTER — Telehealth: Payer: Self-pay | Admitting: Internal Medicine

## 2021-06-01 VITALS — BP 136/72 | HR 69

## 2021-06-01 DIAGNOSIS — J22 Unspecified acute lower respiratory infection: Secondary | ICD-10-CM

## 2021-06-01 DIAGNOSIS — J069 Acute upper respiratory infection, unspecified: Secondary | ICD-10-CM

## 2021-06-01 MED ORDER — AZITHROMYCIN 250 MG PO TABS: ORAL_TABLET | ORAL | 0 refills | Status: AC

## 2021-06-01 NOTE — Patient Instructions (Signed)
Zithromax Z pak

## 2021-06-01 NOTE — Telephone Encounter (Signed)
Went ahead and scheduled appointment since he had chest rattling and congestion and COVID test was negative

## 2021-06-01 NOTE — Progress Notes (Signed)
° °  Subjective:    Patient ID: Jimmy [name], male    DOB: [birth year], 81 y.o.   MRN: [id number]  HPI 81 year old Male with cough and URI symptoms for about a week. No travel history.  I records indicate he has had a flu vaccine this season.  He did receive a COVID booster January 11 of this year.  Patient says he had onset of cough, runny nose and scratchy throat about a week ago.  Cough got worse along with  chest congestion.  COVID test at home was negative.  No documented fever.  No shaking chills.    Review of Systems no fever or shaking chills     Objective:   Physical Exam Patient is afebrile.  Blood pressure 136/72 pulse 69 pulse oximetry 99% on room air  TMs are clear.  Chest is clear without rales or wheezing.  Pharynx is clear.  Neck is supple.  No cervical adenopathy.       Assessment & Plan:   Acute lower respiratory infection  Plan: Zithromax Z-PAK 2 tabs day 1 followed by 1 tab days 2 through 5.  Rest and drink plenty of fluids. Call if not better in 7-10 days or sooner if worse.

## 2021-06-01 NOTE — Telephone Encounter (Signed)
Jimmy [name] [phone]  Liliane Channel called to say he started out with cough, runny nose and scratchy throat about a week ago, he now has bad cough and chest congestion with rattling in his chest. COVID test was negative.

## 2021-06-07 DIAGNOSIS — R269 Unspecified abnormalities of gait and mobility: Secondary | ICD-10-CM | POA: Diagnosis not present

## 2021-06-07 DIAGNOSIS — M545 Low back pain, unspecified: Secondary | ICD-10-CM | POA: Diagnosis not present

## 2021-06-11 DIAGNOSIS — Z8249 Family history of ischemic heart disease and other diseases of the circulatory system: Secondary | ICD-10-CM | POA: Insufficient documentation

## 2021-06-11 NOTE — Progress Notes (Signed)
Cardiology Office Note   Date:  06/13/2021   ID:  Jimmy [name], DOB Aug 22, 1940, MRN [id number]  PCP:  Jimmy Showers, MD  Cardiologist:   None Referring:  Jimmy Showers, MD  Chief Complaint  Patient presents with   Shortness of Breath      History of Present Illness: Jimmy [name] is a 81 y.o. male who presents for evaluation of strong family history of coronary artery disease.  His father had his first event probably at age 36.  He has not had any prior cardiac history or testing.  He is not particularly active in the winter although he does some activities in the summer.  He does get short of breath with moderate activity.  He does not describe resting shortness of breath and does not have PND or orthopnea.  He does not have chest pressure, neck or arm discomfort.  He has had some weight gain over the last couple of years.  He is not having any swelling.   Past Medical History:  Diagnosis Date   Depression    Hyperlipidemia    Hypertension     Past Surgical History:  Procedure Laterality Date   left knee arthroscopy     TONSILLECTOMY       Current Outpatient Medications  Medication Sig Dispense Refill   allopurinol (ZYLOPRIM) 300 MG tablet TAKE 1 TABLET(300 MG) BY MOUTH DAILY 90 tablet 3   ALPRAZolam (XANAX) 0.5 MG tablet One half to one tab 3 times daily as needed for vertigo 30 tablet 1   amLODipine (NORVASC) 5 MG tablet TAKE 1 TABLET(5 MG) BY MOUTH DAILY 90 tablet 3   aspirin 81 MG tablet Take 81 mg by mouth daily.     Cholecalciferol (VITAMIN D3) 2000 UNITS TABS Take 1 tablet by mouth daily.     Fiber CAPS Take 1 tablet by mouth 2 (two) times daily.     FLUoxetine (PROZAC) 20 MG capsule TAKE 1 CAPSULE(20 MG) BY MOUTH DAILY 90 capsule 3   hydrochlorothiazide (HYDRODIURIL) 25 MG tablet TAKE 1 TABLET(25 MG) BY MOUTH DAILY 90 tablet 3   loratadine (CLARITIN) 10 MG tablet Take 10 mg by mouth daily.     sildenafil (VIAGRA) 100 MG tablet TAKE ONE TABLET BY MOUTH  DAILY  AN HOUR BEFORE INTERCOURSE 6 tablet 11   simvastatin (ZOCOR) 20 MG tablet TAKE 1 TABLET(20 MG) BY MOUTH AT BEDTIME 90 tablet 3   trandolapril (MAVIK) 2 MG tablet TAKE 1 TABLET(2 MG) BY MOUTH DAILY 90 tablet 3   No current facility-administered medications for this visit.    Allergies:   Patient has no known allergies.    Social History:  The patient  reports that he quit smoking about 53 years ago. His smoking use included cigarettes. He has a 1.50 pack-year smoking history. He has never used smokeless tobacco. He reports current alcohol use of about 24.0 standard drinks per week. He reports that he does not use drugs.   Family History:  The patient's family history includes Alcohol abuse in his father; Cancer in his brother; Heart disease (age of onset: 75) in his father; Hypertension in his mother; Stroke in his mother.    ROS:  Please see the history of present illness.   Otherwise, review of systems are positive for decrease in memory.   All other systems are reviewed and negative.    PHYSICAL EXAM: VS:  BP 138/80    Pulse 87    Ht  5' 10.5" (1.791 m)    Wt 232 lb (105.2 kg)    SpO2 96%    BMI 32.82 kg/m  , BMI Body mass index is 32.82 kg/m. GENERAL:  Well appearing HEENT:  Pupils equal round and reactive, fundi not visualized, oral mucosa unremarkable NECK:  No jugular venous distention, waveform within normal limits, carotid upstroke brisk and symmetric, left carotid positive bruits, no thyromegaly LYMPHATICS:  No cervical, inguinal adenopathy LUNGS:  Clear to auscultation bilaterally BACK:  No CVA tenderness CHEST:  Unremarkable HEART:  PMI not displaced or sustained,S1 and S2 within normal limits, no S3, no S4, no clicks, no rubs, no murmurs ABD:  Flat, positive bowel sounds normal in frequency in pitch, no bruits, no rebound, no guarding, no midline pulsatile mass, no hepatomegaly, no splenomegaly EXT:  2 plus pulses throughout, no edema, no cyanosis no clubbing SKIN:   No rashes no nodules NEURO:  Cranial nerves II through XII grossly intact, motor grossly intact throughout PSYCH:  Cognitively intact, oriented to person place and time    EKG:  EKG is ordered today. The ekg ordered today demonstrates sinus rhythm, rate 87, left axis deviation, left anterior fascicular block, premature ventricular contraction, possible left atrial enlargement, no acute ST-T wave changes.   Recent Labs: 11/01/2020: ALT 17; BUN 14; Creat 0.94; Hemoglobin 15.1; Platelets 183; Potassium 3.9; Sodium 134    Lipid Panel    Component Value Date/Time   CHOL 169 05/08/2021 0907   TRIG 134 05/08/2021 0907   HDL 59 05/08/2021 0907   CHOLHDL 2.9 05/08/2021 0907   VLDL 24 09/04/2016 1022   LDLCALC 87 05/08/2021 0907      Wt Readings from Last 3 Encounters:  06/13/21 232 lb (105.2 kg)  05/09/21 232 lb (105.2 kg)  01/03/21 232 lb (105.2 kg)      Other studies Reviewed: Additional studies/ records that were reviewed today include: Labs. Review of the above records demonstrates:  Please see elsewhere in the note.     ASSESSMENT AND PLAN:  SOB: Patient does have some shortness of breath and risk factors. I will bring the patient back for a POET (Plain Old Exercise Test). This will allow me to screen for obstructive coronary disease, risk stratify and very importantly provide a prescription for exercise.  BRUIT: I will check carotid Dopplers  OVERWEIGHT: We had a long discussion about diet and I gave him specific instructions on this and exercise.  Current medicines are reviewed at length with the patient today.  The patient does not have concerns regarding medicines.  The following changes have been made:  no change  Labs/ tests ordered today include:   Orders Placed This Encounter  Procedures   Exercise Tolerance Test   EKG 12-Lead   VAS US CAROTID     Disposition:   FU with as needed based on the results of the above   Signed, Minus Breeding, MD   06/13/2021 3:00 PM    [place]

## 2021-06-13 ENCOUNTER — Encounter: Payer: Self-pay | Admitting: Cardiology

## 2021-06-13 ENCOUNTER — Other Ambulatory Visit: Payer: Self-pay

## 2021-06-13 ENCOUNTER — Ambulatory Visit: Payer: Medicare Other | Admitting: Cardiology

## 2021-06-13 VITALS — BP 138/80 | HR 87 | Ht 70.5 in | Wt 232.0 lb

## 2021-06-13 DIAGNOSIS — R0989 Other specified symptoms and signs involving the circulatory and respiratory systems: Secondary | ICD-10-CM | POA: Insufficient documentation

## 2021-06-13 DIAGNOSIS — Z8249 Family history of ischemic heart disease and other diseases of the circulatory system: Secondary | ICD-10-CM | POA: Diagnosis not present

## 2021-06-13 DIAGNOSIS — R0602 Shortness of breath: Secondary | ICD-10-CM

## 2021-06-13 NOTE — Patient Instructions (Signed)
°  Testing/Procedures:  Your physician has requested that you have an exercise tolerance test. For further information please visit [email or web address]. Please also follow instruction sheet, as given. [facility] physician has requested that you have a carotid duplex. This test is an ultrasound of the carotid arteries in your neck. It looks at blood flow through these arteries that supply the brain with blood. Allow one hour for this exam. There are no restrictions or special instructions. [facility]   Follow-Up: At [facility], you and your health needs are our priority.  As part of our continuing mission to provide you with exceptional heart care, we have created designated Provider Care Teams.  These Care Teams include your primary Cardiologist (physician) and Advanced Practice Providers (APPs -  Physician Assistants and Nurse Practitioners) who all work together to provide you with the care you need, when you need it.  We recommend signing up for the patient portal called "MyChart".  Sign up information is provided on this After Visit Summary.  MyChart is used to connect with patients for Virtual Visits (Telemedicine).  Patients are able to view lab/test results, encounter notes, upcoming appointments, etc.  Non-urgent messages can be sent to your provider as well.   To learn more about what you can do with MyChart, go to [email or web address].    Your next appointment:    AS NEEDED

## 2021-06-15 ENCOUNTER — Other Ambulatory Visit: Payer: Self-pay

## 2021-06-15 ENCOUNTER — Ambulatory Visit (HOSPITAL_COMMUNITY)
Admission: RE | Admit: 2021-06-15 | Discharge: 2021-06-15 | Disposition: A | Discharge status: Home / Self Care | Payer: Medicare Other | Source: Ambulatory Visit | Attending: Internal Medicine | Admitting: Internal Medicine

## 2021-06-15 DIAGNOSIS — R0989 Other specified symptoms and signs involving the circulatory and respiratory systems: Secondary | ICD-10-CM | POA: Insufficient documentation

## 2021-06-16 ENCOUNTER — Telehealth (HOSPITAL_COMMUNITY): Payer: Self-pay | Admitting: *Deleted

## 2021-06-16 NOTE — Telephone Encounter (Signed)
Close encounter 

## 2021-06-20 ENCOUNTER — Ambulatory Visit (HOSPITAL_COMMUNITY)
Admission: RE | Admit: 2021-06-20 | Discharge: 2021-06-20 | Disposition: A | Discharge status: Home / Self Care | Payer: Medicare Other | Source: Ambulatory Visit | Attending: Cardiology | Admitting: Cardiology

## 2021-06-20 ENCOUNTER — Other Ambulatory Visit: Payer: Self-pay

## 2021-06-20 DIAGNOSIS — R0602 Shortness of breath: Secondary | ICD-10-CM | POA: Insufficient documentation

## 2021-06-20 LAB — EXERCISE TOLERANCE TEST
Angina Index: 0
Duke Treadmill Score: 6
Estimated workload: 7
Exercise duration (min): 6 min
Exercise duration (sec): 0 s
MPHR: 142 {beats}/min
Peak HR: 157 {beats}/min
Percent HR: 110 %
Rest HR: 75 {beats}/min
ST Depression (mm): 0 mm

## 2021-07-04 DIAGNOSIS — R269 Unspecified abnormalities of gait and mobility: Secondary | ICD-10-CM | POA: Diagnosis not present

## 2021-07-04 DIAGNOSIS — M545 Low back pain, unspecified: Secondary | ICD-10-CM | POA: Diagnosis not present

## 2021-07-19 ENCOUNTER — Other Ambulatory Visit (HOSPITAL_COMMUNITY): Payer: Self-pay | Admitting: Cardiology

## 2021-07-19 DIAGNOSIS — I6523 Occlusion and stenosis of bilateral carotid arteries: Secondary | ICD-10-CM

## 2021-08-09 DIAGNOSIS — R269 Unspecified abnormalities of gait and mobility: Secondary | ICD-10-CM | POA: Diagnosis not present

## 2021-08-09 DIAGNOSIS — M545 Low back pain, unspecified: Secondary | ICD-10-CM | POA: Diagnosis not present

## 2021-09-06 DIAGNOSIS — R269 Unspecified abnormalities of gait and mobility: Secondary | ICD-10-CM | POA: Diagnosis not present

## 2021-09-06 DIAGNOSIS — M545 Low back pain, unspecified: Secondary | ICD-10-CM | POA: Diagnosis not present

## 2021-10-04 ENCOUNTER — Other Ambulatory Visit: Payer: Self-pay | Admitting: Internal Medicine

## 2021-10-04 DIAGNOSIS — R269 Unspecified abnormalities of gait and mobility: Secondary | ICD-10-CM | POA: Diagnosis not present

## 2021-10-04 DIAGNOSIS — M545 Low back pain, unspecified: Secondary | ICD-10-CM | POA: Diagnosis not present

## 2021-11-01 DIAGNOSIS — R269 Unspecified abnormalities of gait and mobility: Secondary | ICD-10-CM | POA: Diagnosis not present

## 2021-11-01 DIAGNOSIS — M545 Low back pain, unspecified: Secondary | ICD-10-CM | POA: Diagnosis not present

## 2021-11-09 ENCOUNTER — Other Ambulatory Visit: Payer: Medicare Other

## 2021-11-09 DIAGNOSIS — I10 Essential (primary) hypertension: Secondary | ICD-10-CM

## 2021-11-09 DIAGNOSIS — Z125 Encounter for screening for malignant neoplasm of prostate: Secondary | ICD-10-CM

## 2021-11-09 DIAGNOSIS — E7849 Other hyperlipidemia: Secondary | ICD-10-CM | POA: Diagnosis not present

## 2021-11-10 LAB — COMPLETE METABOLIC PANEL WITH GFR
AG Ratio: 1.8 (calc) (ref 1.0–2.5)
ALT: 20 U/L (ref 9–46)
AST: 19 U/L (ref 10–35)
Albumin: 4.4 g/dL (ref 3.6–5.1)
Alkaline phosphatase (APISO): 64 U/L (ref 35–144)
BUN: 15 mg/dL (ref 7–25)
CO2: 30 mmol/L (ref 20–32)
Calcium: 9.3 mg/dL (ref 8.6–10.3)
Chloride: 99 mmol/L (ref 98–110)
Creat: 0.99 mg/dL (ref 0.70–1.28)
Globulin: 2.4 g/dL (calc) (ref 1.9–3.7)
Glucose, Bld: 99 mg/dL (ref 65–99)
Potassium: 4.3 mmol/L (ref 3.5–5.3)
Sodium: 136 mmol/L (ref 135–146)
Total Bilirubin: 0.5 mg/dL (ref 0.2–1.2)
Total Protein: 6.8 g/dL (ref 6.1–8.1)
eGFR: 77 mL/min/{1.73_m2} (ref >=60)

## 2021-11-10 LAB — PSA: PSA: 0.56 ng/mL (ref <=4.00)

## 2021-11-10 LAB — LIPID PANEL
Cholesterol: 145 mg/dL (ref <200)
HDL: 66 mg/dL (ref >=40)
LDL Cholesterol (Calc): 65 mg/dL (calc)
Non-HDL Cholesterol (Calc): 79 mg/dL (calc) (ref <130)
Total CHOL/HDL Ratio: 2.2 (calc) (ref <5.0)
Triglycerides: 61 mg/dL (ref <150)

## 2021-11-10 LAB — CBC WITH DIFFERENTIAL/PLATELET
Absolute Monocytes: 482 cells/uL (ref 200–950)
Basophils Absolute: 39 cells/uL (ref 0–200)
Basophils Relative: 0.9 %
Eosinophils Absolute: 112 cells/uL (ref 15–500)
Eosinophils Relative: 2.6 %
HCT: 44.3 % (ref 38.5–50.0)
Hemoglobin: 14.8 g/dL (ref 13.2–17.1)
Lymphs Abs: 1170 cells/uL (ref 850–3900)
MCH: 32.7 pg (ref 27.0–33.0)
MCHC: 33.4 g/dL (ref 32.0–36.0)
MCV: 98 fL (ref 80.0–100.0)
MPV: 10.1 fL (ref 7.5–12.5)
Monocytes Relative: 11.2 %
Neutro Abs: 2498 cells/uL (ref 1500–7800)
Neutrophils Relative %: 58.1 %
Platelets: 183 10*3/uL (ref 140–400)
RBC: 4.52 10*6/uL (ref 4.20–5.80)
RDW: 13.3 % (ref 11.0–15.0)
Total Lymphocyte: 27.2 %
WBC: 4.3 10*3/uL (ref 3.8–10.8)

## 2021-11-14 ENCOUNTER — Encounter: Payer: Self-pay | Admitting: Internal Medicine

## 2021-11-14 ENCOUNTER — Ambulatory Visit (INDEPENDENT_AMBULATORY_CARE_PROVIDER_SITE_OTHER): Payer: Medicare Other | Admitting: Internal Medicine

## 2021-11-14 VITALS — BP 128/72 | HR 82 | Temp 97.8°F | Ht 66.5 in | Wt 228.8 lb

## 2021-11-14 DIAGNOSIS — Z87898 Personal history of other specified conditions: Secondary | ICD-10-CM | POA: Diagnosis not present

## 2021-11-14 DIAGNOSIS — Z0000 Encounter for general adult medical examination without abnormal findings: Secondary | ICD-10-CM

## 2021-11-14 DIAGNOSIS — I10 Essential (primary) hypertension: Secondary | ICD-10-CM | POA: Diagnosis not present

## 2021-11-14 DIAGNOSIS — Z6836 Body mass index (BMI) 36.0-36.9, adult: Secondary | ICD-10-CM

## 2021-11-14 DIAGNOSIS — Z1211 Encounter for screening for malignant neoplasm of colon: Secondary | ICD-10-CM

## 2021-11-14 DIAGNOSIS — Z8739 Personal history of other diseases of the musculoskeletal system and connective tissue: Secondary | ICD-10-CM

## 2021-11-14 DIAGNOSIS — Z8249 Family history of ischemic heart disease and other diseases of the circulatory system: Secondary | ICD-10-CM

## 2021-11-14 DIAGNOSIS — E7800 Pure hypercholesterolemia, unspecified: Secondary | ICD-10-CM | POA: Diagnosis not present

## 2021-11-14 LAB — HEMOCCULT GUIAC POC 1CARD (OFFICE): Fecal Occult Blood, POC: NEGATIVE

## 2021-11-14 LAB — POCT URINALYSIS DIPSTICK
Bilirubin, UA: NEGATIVE
Blood, UA: NEGATIVE
Glucose, UA: NEGATIVE
Ketones, UA: NEGATIVE
Leukocytes, UA: NEGATIVE
Nitrite, UA: NEGATIVE
Protein, UA: NEGATIVE
Spec Grav, UA: <=1.005 — AB (ref 1.010–1.025)
Urobilinogen, UA: 0.2 E.U./dL
pH, UA: 6.5 (ref 5.0–8.0)

## 2021-11-14 NOTE — Progress Notes (Unsigned)
     Annual Wellness Visit     Patient: Jimmy [name], Male    DOB: [birth year], 81 y.o.   MRN: [id number] Visit Date: 11/14/2021  Chief Complaint  Patient presents with   Medicare Wellness   Subjective    [name] is a 81 y.o. male who presents today for his Annual Wellness Visit.  HPI 81 year old Male seen for annual Medicare wellness visit and health maintenance exam in addition to evaluation of medical issues.   Social History   Social History Narrative   Not on file    Patient Care Team: Arika Mainer, Cresenciano Lick, MD as PCP - General (Internal Medicine)  Review of Systems   Objective    Vitals: BP 128/72   Pulse 82   Temp 97.8 F (36.6 C) (Tympanic)   Ht 5' 6.5" (1.689 m)   Wt 228 lb 12 oz (103.8 kg)   SpO2 98%   BMI 36.37 kg/m   Physical Exam   Most recent functional status assessment:    11/14/2021   11:03 AM  In your present state of health, do you have any difficulty performing the following activities:  Hearing? 0  Vision? 0  Difficulty concentrating or making decisions? 0  Walking or climbing stairs? 0  Dressing or bathing? 0  Doing errands, shopping? 0  Preparing Food and eating ? N  Using the Toilet? N  In the past six months, have you accidently leaked urine? N  Do you have problems with loss of bowel control? N  Managing your Medications? N  Managing your Finances? N  Housekeeping or managing your Housekeeping? N   Most recent fall risk assessment:    11/14/2021   11:03 AM  Fall Risk   Falls in the past year? 0  Number falls in past yr: 0  Injury with Fall? 0  Risk for fall due to : No Fall Risks  Follow up Falls evaluation completed    Most recent depression screenings:    11/14/2021   11:03 AM 11/07/2020   11:07 AM  PHQ 2/9 Scores  PHQ - 2 Score 0 0   Most recent cognitive screening:    11/14/2021   11:03 AM  6CIT Screen  What Year? 0 points  What month? 0 points  What time? 0 points  Count back from 20 0 points   Months in reverse 0 points  Repeat phrase 0 points  Total Score 0 points       Assessment & Plan     Annual wellness visit done today including the all of the following: Reviewed patient's Family Medical History Reviewed and updated list of patient's medical providers Assessment of cognitive impairment was done Assessed patient's functional ability Established a written schedule for health screening [place] Completed and Reviewed  Discussed health benefits of physical activity, and encouraged him to engage in regular exercise appropriate for his age and condition.         {provider attestation***:1}   Angus Seller, CMA

## 2021-11-22 DIAGNOSIS — H6123 Impacted cerumen, bilateral: Secondary | ICD-10-CM | POA: Diagnosis not present

## 2021-12-21 NOTE — Patient Instructions (Addendum)
It was a pleasure to see you today.  Work on diet exercise and weight loss and continue current medications.  Will need COVID booster in the Fall along with flu vaccine.  Consider pneumococcal 20 vaccine and RSV vaccine.  These can be obtained at pharmacy.  Hemoccult card is negative.

## 2021-12-27 DIAGNOSIS — R269 Unspecified abnormalities of gait and mobility: Secondary | ICD-10-CM | POA: Diagnosis not present

## 2021-12-27 DIAGNOSIS — M545 Low back pain, unspecified: Secondary | ICD-10-CM | POA: Diagnosis not present

## 2021-12-31 ENCOUNTER — Other Ambulatory Visit: Payer: Self-pay | Admitting: Internal Medicine

## 2022-01-03 ENCOUNTER — Ambulatory Visit (INDEPENDENT_AMBULATORY_CARE_PROVIDER_SITE_OTHER): Payer: Medicare Other

## 2022-01-03 VITALS — BP 134/80 | Temp 97.9°F

## 2022-01-03 DIAGNOSIS — Z23 Encounter for immunization: Secondary | ICD-10-CM

## 2022-01-03 NOTE — Progress Notes (Deleted)
     Annual Wellness Visit     Patient: Jimmy [name], Male    DOB: 10-23-1940, 81 y.o.   MRN: [id number] Visit Date: 01/03/2022  Chief Complaint  Patient presents with   Flu Vaccine   Subjective    Rohin TERANCE POMPLUN is a 81 y.o. male who presents today for his Annual Wellness Visit.  HPI   Social History   Social History Narrative   Not on file    Patient Care Team: Elby Showers, MD as PCP - General (Internal Medicine)  Review of Systems   Objective    Vitals: BP 134/80   Temp 97.9 F (36.6 C) (Tympanic)   Physical Exam   Most recent functional status assessment:    11/14/2021   11:03 AM  In your present state of health, do you have any difficulty performing the following activities:  Hearing? 0  Vision? 0  Difficulty concentrating or making decisions? 0  Walking or climbing stairs? 0  Dressing or bathing? 0  Doing errands, shopping? 0  Preparing Food and eating ? N  Using the Toilet? N  In the past six months, have you accidently leaked urine? N  Do you have problems with loss of bowel control? N  Managing your Medications? N  Managing your Finances? N  Housekeeping or managing your Housekeeping? N   Most recent fall risk assessment:    11/14/2021   11:03 AM  Fall Risk   Falls in the past year? 0  Number falls in past yr: 0  Injury with Fall? 0  Risk for fall due to : No Fall Risks  Follow up Falls evaluation completed    Most recent depression screenings:    11/14/2021   11:03 AM 11/07/2020   11:07 AM  PHQ 2/9 Scores  PHQ - 2 Score 0 0   Most recent cognitive screening:    11/14/2021   11:03 AM  6CIT Screen  What Year? 0 points  What month? 0 points  What time? 0 points  Count back from 20 0 points  Months in reverse 0 points  Repeat phrase 0 points  Total Score 0 points       Assessment & Plan     Annual wellness visit done today including the all of the following: Reviewed patient's Family Medical History Reviewed  and updated list of patient's medical providers Assessment of cognitive impairment was done Assessed patient's functional ability Established a written schedule for health screening [place] Completed and Reviewed  Discussed health benefits of physical activity, and encouraged him to engage in regular exercise appropriate for his age and condition.            Kyson Kupper Barron Alvine, CMA

## 2022-01-03 NOTE — Progress Notes (Deleted)
Flu vaccine given by CMA 

## 2022-01-17 DIAGNOSIS — R269 Unspecified abnormalities of gait and mobility: Secondary | ICD-10-CM | POA: Diagnosis not present

## 2022-01-17 DIAGNOSIS — M545 Low back pain, unspecified: Secondary | ICD-10-CM | POA: Diagnosis not present

## 2022-01-23 DIAGNOSIS — H02834 Dermatochalasis of left upper eyelid: Secondary | ICD-10-CM | POA: Diagnosis not present

## 2022-01-23 DIAGNOSIS — H25813 Combined forms of age-related cataract, bilateral: Secondary | ICD-10-CM | POA: Diagnosis not present

## 2022-01-23 DIAGNOSIS — H40033 Anatomical narrow angle, bilateral: Secondary | ICD-10-CM | POA: Diagnosis not present

## 2022-01-23 DIAGNOSIS — H02831 Dermatochalasis of right upper eyelid: Secondary | ICD-10-CM | POA: Diagnosis not present

## 2022-01-29 DIAGNOSIS — H40033 Anatomical narrow angle, bilateral: Secondary | ICD-10-CM | POA: Diagnosis not present

## 2022-02-07 DIAGNOSIS — R269 Unspecified abnormalities of gait and mobility: Secondary | ICD-10-CM | POA: Diagnosis not present

## 2022-02-07 DIAGNOSIS — M545 Low back pain, unspecified: Secondary | ICD-10-CM | POA: Diagnosis not present

## 2022-02-19 DIAGNOSIS — H40032 Anatomical narrow angle, left eye: Secondary | ICD-10-CM | POA: Diagnosis not present

## 2022-03-05 ENCOUNTER — Telehealth: Payer: Self-pay | Admitting: Internal Medicine

## 2022-03-05 NOTE — Telephone Encounter (Signed)
This message was sent via [place], a product from [employer] http://www.bis[employer].com/                    -------Fax Transmission Report-------  To:               Recipient at [phone] Subject:          FW: Hp Scans Result:           The transmission was successful. Explanation:      All Pages Ok Pages Sent:       4 Connect Time:     1 minutes, 44 seconds Transmit Time:    03/05/2022 16:53 Transfer Rate:    14400 Status Code:      0000 Retry Count:      0 Job Id:           [id number] Unique Id:        [id number] Fax Line:         27 Fax Server:       [id number]

## 2022-03-05 NOTE — Telephone Encounter (Signed)
Faxed signed orders back to [facility] [phone], phone [phone]

## 2022-03-07 DIAGNOSIS — M545 Low back pain, unspecified: Secondary | ICD-10-CM | POA: Diagnosis not present

## 2022-03-07 DIAGNOSIS — R269 Unspecified abnormalities of gait and mobility: Secondary | ICD-10-CM | POA: Diagnosis not present

## 2022-03-13 ENCOUNTER — Ambulatory Visit (INDEPENDENT_AMBULATORY_CARE_PROVIDER_SITE_OTHER): Payer: Medicare Other | Admitting: Internal Medicine

## 2022-03-13 ENCOUNTER — Telehealth: Payer: Self-pay | Admitting: Internal Medicine

## 2022-03-13 ENCOUNTER — Encounter: Payer: Self-pay | Admitting: Internal Medicine

## 2022-03-13 VITALS — BP 122/80 | HR 91 | Temp 98.9°F | Ht 66.5 in | Wt 235.4 lb

## 2022-03-13 DIAGNOSIS — M5416 Radiculopathy, lumbar region: Secondary | ICD-10-CM | POA: Diagnosis not present

## 2022-03-13 MED ORDER — PREDNISONE 10 MG PO TABS: ORAL_TABLET | ORAL | 0 refills | Status: DC

## 2022-03-13 MED ORDER — HYDROCODONE-ACETAMINOPHEN 10-325 MG PO TABS: 1.0000 | ORAL_TABLET | Freq: Three times a day (TID) | ORAL | 0 refills | Status: AC | PRN

## 2022-03-13 NOTE — Telephone Encounter (Signed)
Jimmy [name] [id number]  Liliane Channel called to see if you could see him to day, he is having back pain and left leg, thigh pain that he feels is causing blood pressure to be elevated 155/90, he thinks he may have pulled hamstring. He has been going to physical therapy. This pain is worse than he has been having.

## 2022-03-13 NOTE — Telephone Encounter (Signed)
scheduled

## 2022-03-13 NOTE — Patient Instructions (Signed)
MRI of the LS spine ordered.  Take prednisone in tapering course over 12 days as directed.  Take Norco 10/325 1/2 to 1 tablet every 8 hours as needed for severe pain.  No heavy lifting.  Continue other medications as previously prescribed.

## 2022-03-13 NOTE — Progress Notes (Signed)
   Subjective:    Patient ID: Jimmy [name], male    DOB: Jun 12, 1940, 81 y.o.   MRN: [id number]  HPI 81 year old Male has been going to [facility] recently for left back pain.  Orders were faxed to [facility] from this office on November 13.  Patient has a history of hypertension, low back pain, gout and hyperlipidemia.  History of vertigo in November 2021.  Social history: Divorced.  Does not smoke.  Social alcohol consumption.  Has adult children.  He is Recruitment consultant.  Family history: Father with history of CABG and deceased with an acute coronary event.  Mother with history of stroke and hypertension died suddenly of an acute arrest  Stroke or MI.  1 sister died in a motor vehicle accident.  2 brothers-one of them has history of lymphoma but has been doing well.  2 sisters in good health.  Colonoscopy due in 2024.  Review of Systems patient denies heavy lifting, heavy exercise.  He is in moderate distress and is limping a bit on the left lower extremity.  He is having pain in left buttock and down left thigh.     Objective:   Physical Exam  Vital signs reviewed.  Blood pressure 122/80 pulse 91 pulse oximetry 98% weight 235 pounds 6.4 ounces BMI 37.43      Assessment & Plan:  Left sciatica-has been going to PT but has not improved.  Has symptoms consistent with lumbar radiculopathy.  Will order MRI of the LS spine.  Placed on prednisone 10 mg to start 6 tablets day 1 and decrease every 2 days x 1 tablet over 12 days.Marland Kitchen  He will take Norco 10/325 1/2 to 1 tablet sparingly every 8 hours as needed for severe pain.  Advised not to do heavy lifting and not pursue PT at the present time.  MRI of the LS spine ordered.  Hypertension-stable on current regimen  History of vertigo  BMI 37.43

## 2022-03-14 ENCOUNTER — Other Ambulatory Visit: Payer: Self-pay | Admitting: Internal Medicine

## 2022-03-14 DIAGNOSIS — M5416 Radiculopathy, lumbar region: Secondary | ICD-10-CM

## 2022-03-19 DIAGNOSIS — M79605 Pain in left leg: Secondary | ICD-10-CM | POA: Diagnosis not present

## 2022-03-19 DIAGNOSIS — R269 Unspecified abnormalities of gait and mobility: Secondary | ICD-10-CM | POA: Diagnosis not present

## 2022-03-19 DIAGNOSIS — M545 Low back pain, unspecified: Secondary | ICD-10-CM | POA: Diagnosis not present

## 2022-03-20 ENCOUNTER — Telehealth: Payer: Self-pay | Admitting: Internal Medicine

## 2022-03-20 NOTE — Telephone Encounter (Signed)
Faxed back signed progress report to [facility] [phone], phone [phone]   This message was sent via [facility], a product from [employer] http://www.bis[employer].com/                    -------Fax Transmission Report-------  To:               Recipient at [phone] Subject:          FW: Hp Scans Result:           The transmission was successful. Explanation:      All Pages Ok Pages Sent:       4 Connect Time:     2 minutes, 23 seconds Transmit Time:    03/20/2022 16:38 Transfer Rate:    9600 Status Code:      0000 Retry Count:      0 Job Id:           [id number] Unique Id:        [id number] Fax Line:         32 Fax Server:       [id number]

## 2022-03-24 DIAGNOSIS — M79605 Pain in left leg: Secondary | ICD-10-CM | POA: Diagnosis not present

## 2022-03-24 DIAGNOSIS — M545 Low back pain, unspecified: Secondary | ICD-10-CM | POA: Diagnosis not present

## 2022-03-24 DIAGNOSIS — R269 Unspecified abnormalities of gait and mobility: Secondary | ICD-10-CM | POA: Diagnosis not present

## 2022-03-27 DIAGNOSIS — R269 Unspecified abnormalities of gait and mobility: Secondary | ICD-10-CM | POA: Diagnosis not present

## 2022-03-27 DIAGNOSIS — M545 Low back pain, unspecified: Secondary | ICD-10-CM | POA: Diagnosis not present

## 2022-03-27 DIAGNOSIS — M79605 Pain in left leg: Secondary | ICD-10-CM | POA: Diagnosis not present

## 2022-03-29 ENCOUNTER — Ambulatory Visit
Admission: RE | Admit: 2022-03-29 | Discharge: 2022-03-29 | Disposition: A | Discharge status: Home / Self Care | Payer: Medicare Other | Source: Ambulatory Visit | Attending: Internal Medicine | Admitting: Internal Medicine

## 2022-03-29 DIAGNOSIS — M48061 Spinal stenosis, lumbar region without neurogenic claudication: Secondary | ICD-10-CM | POA: Diagnosis not present

## 2022-03-29 DIAGNOSIS — M545 Low back pain, unspecified: Secondary | ICD-10-CM | POA: Diagnosis not present

## 2022-03-29 DIAGNOSIS — M4316 Spondylolisthesis, lumbar region: Secondary | ICD-10-CM | POA: Diagnosis not present

## 2022-03-29 DIAGNOSIS — M5416 Radiculopathy, lumbar region: Secondary | ICD-10-CM

## 2022-03-29 DIAGNOSIS — R200 Anesthesia of skin: Secondary | ICD-10-CM | POA: Diagnosis not present

## 2022-03-29 DIAGNOSIS — M79605 Pain in left leg: Secondary | ICD-10-CM | POA: Diagnosis not present

## 2022-03-29 DIAGNOSIS — R269 Unspecified abnormalities of gait and mobility: Secondary | ICD-10-CM | POA: Diagnosis not present

## 2022-03-30 NOTE — Progress Notes (Signed)
Mailed copy MRI

## 2022-04-03 DIAGNOSIS — M79605 Pain in left leg: Secondary | ICD-10-CM | POA: Diagnosis not present

## 2022-04-03 DIAGNOSIS — M545 Low back pain, unspecified: Secondary | ICD-10-CM | POA: Diagnosis not present

## 2022-04-03 DIAGNOSIS — R269 Unspecified abnormalities of gait and mobility: Secondary | ICD-10-CM | POA: Diagnosis not present

## 2022-04-05 DIAGNOSIS — R269 Unspecified abnormalities of gait and mobility: Secondary | ICD-10-CM | POA: Diagnosis not present

## 2022-04-05 DIAGNOSIS — M79605 Pain in left leg: Secondary | ICD-10-CM | POA: Diagnosis not present

## 2022-04-05 DIAGNOSIS — M545 Low back pain, unspecified: Secondary | ICD-10-CM | POA: Diagnosis not present

## 2022-04-06 ENCOUNTER — Other Ambulatory Visit: Payer: Self-pay

## 2022-04-06 MED ORDER — AMLODIPINE BESYLATE 5 MG PO TABS: ORAL_TABLET | ORAL | 3 refills | Status: DC

## 2022-04-06 MED ORDER — ALLOPURINOL 300 MG PO TABS: ORAL_TABLET | ORAL | 3 refills | Status: DC

## 2022-04-06 MED ORDER — SIMVASTATIN 20 MG PO TABS: ORAL_TABLET | ORAL | 3 refills | Status: DC

## 2022-04-12 DIAGNOSIS — M545 Low back pain, unspecified: Secondary | ICD-10-CM | POA: Diagnosis not present

## 2022-04-12 DIAGNOSIS — M79605 Pain in left leg: Secondary | ICD-10-CM | POA: Diagnosis not present

## 2022-04-12 DIAGNOSIS — R269 Unspecified abnormalities of gait and mobility: Secondary | ICD-10-CM | POA: Diagnosis not present

## 2022-04-19 DIAGNOSIS — M545 Low back pain, unspecified: Secondary | ICD-10-CM | POA: Diagnosis not present

## 2022-04-19 DIAGNOSIS — R269 Unspecified abnormalities of gait and mobility: Secondary | ICD-10-CM | POA: Diagnosis not present

## 2022-04-19 DIAGNOSIS — M79605 Pain in left leg: Secondary | ICD-10-CM | POA: Diagnosis not present

## 2022-04-26 DIAGNOSIS — M545 Low back pain, unspecified: Secondary | ICD-10-CM | POA: Diagnosis not present

## 2022-04-26 DIAGNOSIS — R269 Unspecified abnormalities of gait and mobility: Secondary | ICD-10-CM | POA: Diagnosis not present

## 2022-04-26 DIAGNOSIS — M79605 Pain in left leg: Secondary | ICD-10-CM | POA: Diagnosis not present

## 2022-04-30 ENCOUNTER — Encounter: Payer: Self-pay | Admitting: Podiatry

## 2022-04-30 ENCOUNTER — Ambulatory Visit: Payer: Medicare Other | Admitting: Podiatry

## 2022-04-30 DIAGNOSIS — M2041 Other hammer toe(s) (acquired), right foot: Secondary | ICD-10-CM | POA: Diagnosis not present

## 2022-04-30 DIAGNOSIS — L84 Corns and callosities: Secondary | ICD-10-CM

## 2022-04-30 NOTE — Progress Notes (Signed)
This patient presents the office with chief complaint of a painful corn on his fifth toe right foot patient's states that this corn comes back and causes pain and discomfort walking and wearing his shoes.   He presents the office today for treatment of his painful corn.  Vascular  Dorsalis pedis and posterior tibial pulses are palpable  B/L.  Capillary return  WNL.  Temperature gradient is  WNL.  Skin turgor  WNL  Sensorium  Senn Weinstein monofilament wire  WNL. Normal tactile sensation.  Nail Exam  Patient has normal nails with no evidence of bacterial or fungal infection.  Orthopedic  Exam  Muscle tone and muscle strength  WNL.  No limitations of motion feet  B/L.  No crepitus or joint effusion noted.  Hammer toe fifth toe right foot..  Bony prominences are unremarkable.  Skin  No open lesions.  Normal skin texture and turgor.  Hel[place] durum fifth toe right foot.  Heloma durum fifth toe secondary to hammer toe fifth right.  ROV.  Discussed surgical correction as needed.  Padding dispensed. Told to use padding as needed.  RTC prn.   Gardiner Barefoot DPM

## 2022-05-02 DIAGNOSIS — M79605 Pain in left leg: Secondary | ICD-10-CM | POA: Diagnosis not present

## 2022-05-02 DIAGNOSIS — R269 Unspecified abnormalities of gait and mobility: Secondary | ICD-10-CM | POA: Diagnosis not present

## 2022-05-02 DIAGNOSIS — M545 Low back pain, unspecified: Secondary | ICD-10-CM | POA: Diagnosis not present

## 2022-05-15 ENCOUNTER — Other Ambulatory Visit: Payer: Medicare Other

## 2022-05-15 DIAGNOSIS — E7800 Pure hypercholesterolemia, unspecified: Secondary | ICD-10-CM | POA: Diagnosis not present

## 2022-05-16 DIAGNOSIS — R269 Unspecified abnormalities of gait and mobility: Secondary | ICD-10-CM | POA: Diagnosis not present

## 2022-05-16 DIAGNOSIS — M545 Low back pain, unspecified: Secondary | ICD-10-CM | POA: Diagnosis not present

## 2022-05-16 DIAGNOSIS — M79605 Pain in left leg: Secondary | ICD-10-CM | POA: Diagnosis not present

## 2022-05-16 LAB — HEPATIC FUNCTION PANEL
AG Ratio: 1.6 (calc) (ref 1.0–2.5)
ALT: 12 U/L (ref 9–46)
AST: 13 U/L (ref 10–35)
Albumin: 3.9 g/dL (ref 3.6–5.1)
Alkaline phosphatase (APISO): 73 U/L (ref 35–144)
Bilirubin, Direct: 0.2 mg/dL (ref 0.0–0.2)
Globulin: 2.5 g/dL (calc) (ref 1.9–3.7)
Indirect Bilirubin: 0.4 mg/dL (calc) (ref 0.2–1.2)
Total Bilirubin: 0.6 mg/dL (ref 0.2–1.2)
Total Protein: 6.4 g/dL (ref 6.1–8.1)

## 2022-05-16 LAB — LIPID PANEL
Cholesterol: 137 mg/dL (ref <200)
HDL: 61 mg/dL (ref >=40)
LDL Cholesterol (Calc): 62 mg/dL (calc)
Non-HDL Cholesterol (Calc): 76 mg/dL (calc) (ref <130)
Total CHOL/HDL Ratio: 2.2 (calc) (ref <5.0)
Triglycerides: 64 mg/dL (ref <150)

## 2022-05-17 ENCOUNTER — Encounter: Payer: Self-pay | Admitting: Internal Medicine

## 2022-05-17 ENCOUNTER — Ambulatory Visit (INDEPENDENT_AMBULATORY_CARE_PROVIDER_SITE_OTHER): Payer: Medicare Other | Admitting: Internal Medicine

## 2022-05-17 VITALS — BP 134/76 | HR 74 | Temp 98.1°F | Ht 66.5 in | Wt 233.8 lb

## 2022-05-17 DIAGNOSIS — Z7185 Encounter for immunization safety counseling: Secondary | ICD-10-CM | POA: Diagnosis not present

## 2022-05-17 DIAGNOSIS — E7800 Pure hypercholesterolemia, unspecified: Secondary | ICD-10-CM | POA: Diagnosis not present

## 2022-05-17 DIAGNOSIS — Z23 Encounter for immunization: Secondary | ICD-10-CM

## 2022-05-17 DIAGNOSIS — Z6837 Body mass index (BMI) 37.0-37.9, adult: Secondary | ICD-10-CM

## 2022-05-17 DIAGNOSIS — Z8739 Personal history of other diseases of the musculoskeletal system and connective tissue: Secondary | ICD-10-CM

## 2022-05-17 DIAGNOSIS — Z8249 Family history of ischemic heart disease and other diseases of the circulatory system: Secondary | ICD-10-CM | POA: Diagnosis not present

## 2022-05-17 DIAGNOSIS — R0789 Other chest pain: Secondary | ICD-10-CM | POA: Diagnosis not present

## 2022-05-17 DIAGNOSIS — I10 Essential (primary) hypertension: Secondary | ICD-10-CM

## 2022-05-17 NOTE — Patient Instructions (Addendum)
Diet and weight loss discussed. May take Ibuprofen for musculoskeletal pain.  Labs are entirely within normal limits.  Pneumococcal 20 vaccine given.  Please get RSV vaccine at pharmacy.  Continue current medications as her labs are excellent.  Return in 6 months for Medicare wellness and health maintenance exam.

## 2022-05-17 NOTE — Addendum Note (Signed)
Addended by: Geradine Girt D on: 05/17/2022 10:38 AM   Modules accepted: Orders

## 2022-05-17 NOTE — Progress Notes (Signed)
Subjective:    Patient ID: Jimmy [name] , male    DOB: [birth year], 82 y.o.    MRN: [id number]   81 y.o. Male presents today for: 6 mo follow up. Hx of HTN and hyperlipidemia. Is overweight.  BMI is 37.17.  Has not been motivated to exercise during the winter and because of musculoskeletal pain.  Has some right-sided anterior chest pain that appears to be chest wall pain.  He has been going to physical therapy but does not recall doing any exercise that would have strained any muscles.  Lipid panel and liver functions are completely normal on statin medication.  His blood pressure is excellent at 134/76.  Needed vaccines discussed.  He prefers not to get a COVID booster.  He agrees to pneumococcal 20 vaccine today.             Family History  Problem Relation Age of Onset   Stroke Mother    Hypertension Mother    Heart disease Father 52       CABG   Alcohol abuse Father    Cancer Brother     Past Medical History:  Diagnosis Date   Depression    Hyperlipidemia    Hypertension      Social History   Social History Narrative   Divorced.  Does not smoke.  Social alcohol consumption.  Has adult children.  He is Recruitment consultant.        Patient Care Team: Elby Showers, MD as PCP - General (Internal Medicine)   ROS he went to physical therapy for musculoskeletal pain and this seems to have resolved on the left side but now has developed some right-sided chest wall pain and is tender along right lower ribs in the midclavicular line      Objective:   Vitals:  Blood pressure 134/76, pulse 74, temperature 98.1 degrees pulse oximetry 99% weight 233 pounds 12.8 ounces BMI 37.17.  In July he weighed 228 pounds and BMI was 36.37.   Physical Exam   Results lipid panel liver functions reviewed.  These are normal.  He is on simvastatin 20 mg daily. MRI L-Spine (03/29/2022) 1. Severe L4-5 facet arthrosis with grade 1 anterolisthesis, severe spinal  stenosis, and severe left neural foraminal stenosis due to a disc extrusion. 2. Moderate to severe spinal stenosis at L3-4. 3. Mild spinal stenosis at L2-3.     Assessment & Plan:   Right chest wall pain-this improves when he takes ibuprofen.  He may take an ibuprofen before going to bed.  This seems to bother him more at night as he cannot get comfortable sometimes until he takes ibuprofen.  He recalls that it started around the time he was going to physical therapy.  BMI 37.17 and has increased from July when it was 36.37-needs to be more active.  Encourage diet exercise and weight loss.  Essential hypertension stable on current regimen of Mavik 2 mg daily which she has taken for years and HCTZ 25 mg daily as well as amlodipine 5 mg daily  History of gout treated with allopurinol  History of anxiety treated with Xanax as needed  Plan: Pneumococcal 20 vaccine given today.  He declines COVID vaccines at this time.  Recommend RSV vaccine.  Return in 6 months for health maintenance exam.  Hyperlipidemia-lipid panel normal: Simvastatin 20 mg daily    I,Alexander Ruley,acting as a scribe for Elby Showers, MD.,have documented all relevant documentation on the behalf  of Elby Showers, MD,as directed by  Elby Showers, MD while in the presence of Elby Showers, MD.   I, Elby Showers, MD, have reviewed all documentation for this visit. The documentation on 05/17/22 for the exam, diagnosis, procedures, and orders are all accurate and complete.

## 2022-05-30 DIAGNOSIS — R269 Unspecified abnormalities of gait and mobility: Secondary | ICD-10-CM | POA: Diagnosis not present

## 2022-05-30 DIAGNOSIS — M79605 Pain in left leg: Secondary | ICD-10-CM | POA: Diagnosis not present

## 2022-05-30 DIAGNOSIS — M545 Low back pain, unspecified: Secondary | ICD-10-CM | POA: Diagnosis not present

## 2022-06-02 ENCOUNTER — Other Ambulatory Visit: Payer: Self-pay | Admitting: Internal Medicine

## 2022-06-13 DIAGNOSIS — M545 Low back pain, unspecified: Secondary | ICD-10-CM | POA: Diagnosis not present

## 2022-06-13 DIAGNOSIS — M79605 Pain in left leg: Secondary | ICD-10-CM | POA: Diagnosis not present

## 2022-06-13 DIAGNOSIS — R269 Unspecified abnormalities of gait and mobility: Secondary | ICD-10-CM | POA: Diagnosis not present

## 2022-06-26 DIAGNOSIS — H40033 Anatomical narrow angle, bilateral: Secondary | ICD-10-CM | POA: Diagnosis not present

## 2022-06-26 DIAGNOSIS — H02834 Dermatochalasis of left upper eyelid: Secondary | ICD-10-CM | POA: Diagnosis not present

## 2022-06-26 DIAGNOSIS — H02831 Dermatochalasis of right upper eyelid: Secondary | ICD-10-CM | POA: Diagnosis not present

## 2022-06-26 DIAGNOSIS — H25813 Combined forms of age-related cataract, bilateral: Secondary | ICD-10-CM | POA: Diagnosis not present

## 2022-06-26 DIAGNOSIS — H43811 Vitreous degeneration, right eye: Secondary | ICD-10-CM | POA: Diagnosis not present

## 2022-06-27 DIAGNOSIS — M545 Low back pain, unspecified: Secondary | ICD-10-CM | POA: Diagnosis not present

## 2022-06-27 DIAGNOSIS — R269 Unspecified abnormalities of gait and mobility: Secondary | ICD-10-CM | POA: Diagnosis not present

## 2022-06-27 DIAGNOSIS — M79605 Pain in left leg: Secondary | ICD-10-CM | POA: Diagnosis not present

## 2022-07-05 ENCOUNTER — Ambulatory Visit (HOSPITAL_COMMUNITY)
Admission: RE | Admit: 2022-07-05 | Discharge: 2022-07-05 | Disposition: A | Discharge status: Home / Self Care | Payer: Medicare Other | Source: Ambulatory Visit | Attending: Internal Medicine | Admitting: Internal Medicine

## 2022-07-05 DIAGNOSIS — I6523 Occlusion and stenosis of bilateral carotid arteries: Secondary | ICD-10-CM | POA: Diagnosis not present

## 2022-07-09 ENCOUNTER — Other Ambulatory Visit: Payer: Self-pay | Admitting: *Deleted

## 2022-07-09 DIAGNOSIS — I6523 Occlusion and stenosis of bilateral carotid arteries: Secondary | ICD-10-CM

## 2022-07-11 DIAGNOSIS — M79605 Pain in left leg: Secondary | ICD-10-CM | POA: Diagnosis not present

## 2022-07-11 DIAGNOSIS — R269 Unspecified abnormalities of gait and mobility: Secondary | ICD-10-CM | POA: Diagnosis not present

## 2022-07-11 DIAGNOSIS — M545 Low back pain, unspecified: Secondary | ICD-10-CM | POA: Diagnosis not present

## 2022-07-25 DIAGNOSIS — R269 Unspecified abnormalities of gait and mobility: Secondary | ICD-10-CM | POA: Diagnosis not present

## 2022-07-25 DIAGNOSIS — M79605 Pain in left leg: Secondary | ICD-10-CM | POA: Diagnosis not present

## 2022-07-25 DIAGNOSIS — M545 Low back pain, unspecified: Secondary | ICD-10-CM | POA: Diagnosis not present

## 2022-08-03 IMAGING — CT CT HEAD W/O CM
4 series · 16 of 47 positions shown, 18 images · non-contrast
Comparison: No comparison studies available.

CLINICAL DATA: Statin onset nausea with dizziness.

EXAM:
CT HEAD WITHOUT CONTRAST
TECHNIQUE: Contiguous axial images were obtained from the base of the skull
through the vertex without intravenous contrast.

[Series 3: head wo · axial · 0.41mm/px · z∈[-132,-12]mm · 7 of 33 slices shown, 9 images]
[im 5/33  brain]
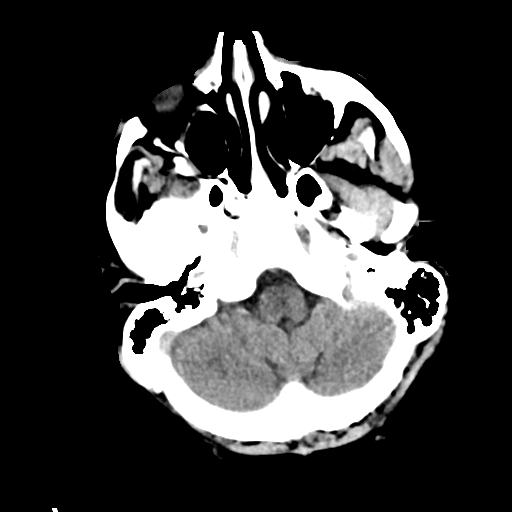
[im 5/33  bone]
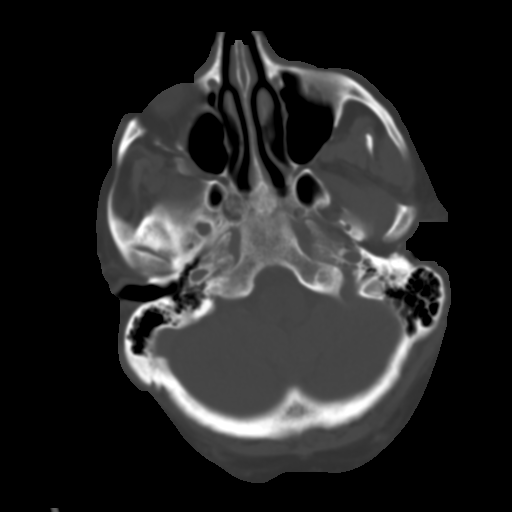
[im 9/33  brain]
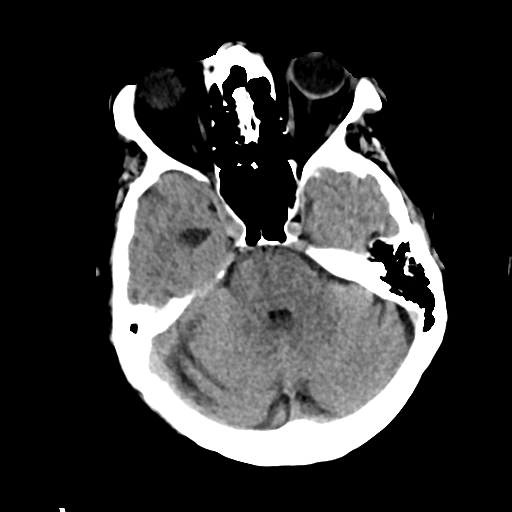
[im 13/33  brain]
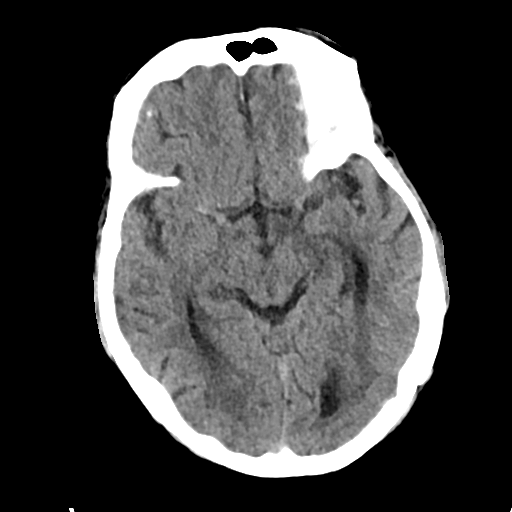
[im 17/33  brain]
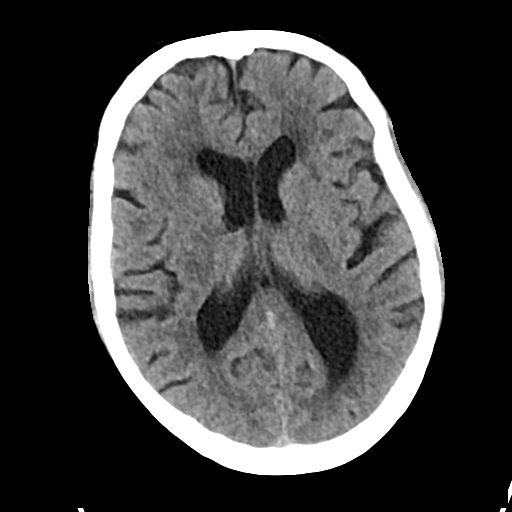
[im 21/33  brain]
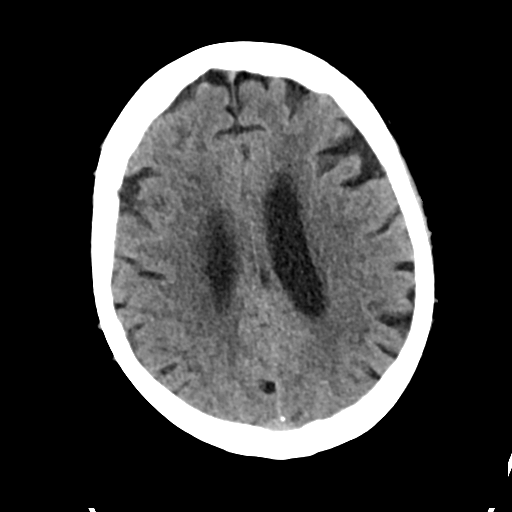
[im 21/33  bone]
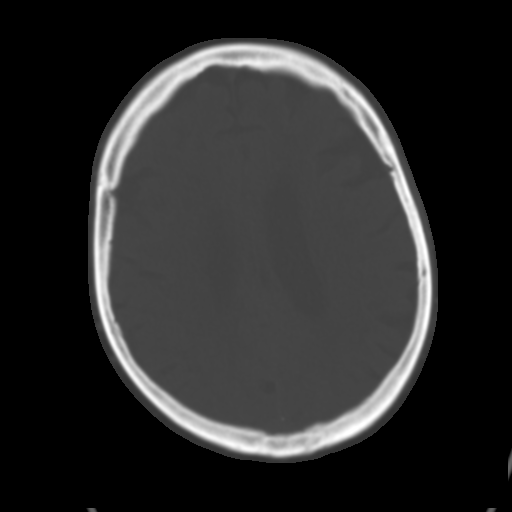
[im 25/33  brain]
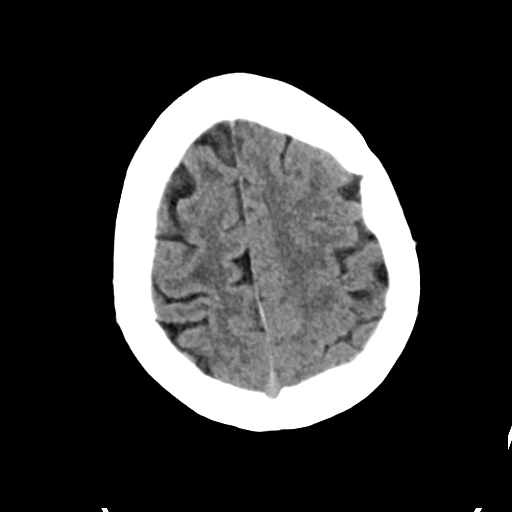
[im 29/33  brain]
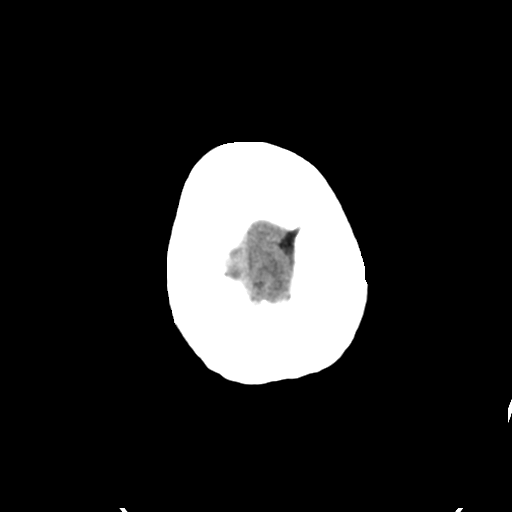

[Series 4: head bone · axial · 0.41mm/px · z∈[-136,-104]mm · 3 of 82 slices shown]
[im 9/82  bone]
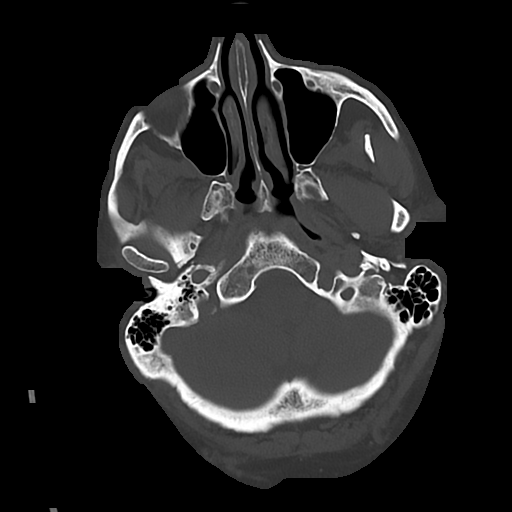
[im 17/82  bone]
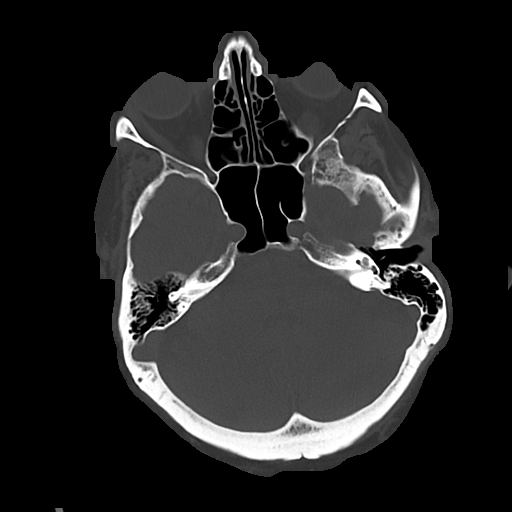
[im 25/82  bone]
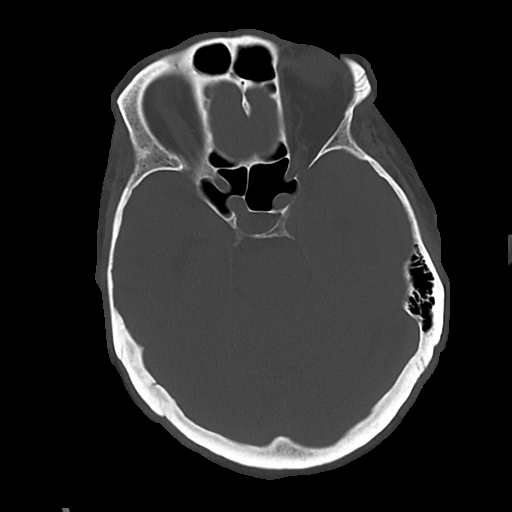

[Series 5: cor soft · coronal · 0.31mm/px · 3 of 70 slices shown]
[im 24/70  brain]
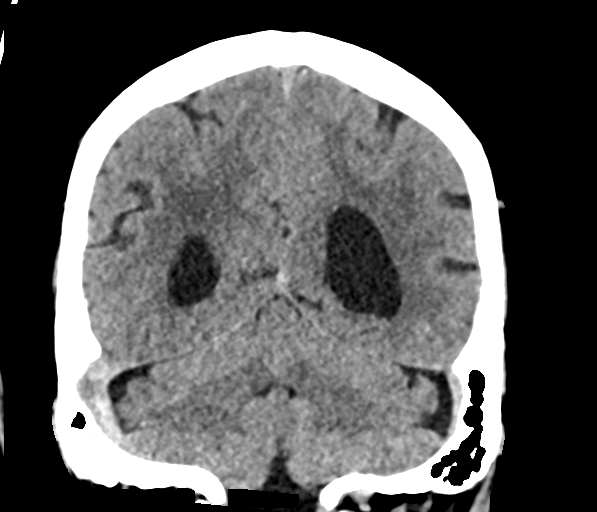
[im 31/70  brain]
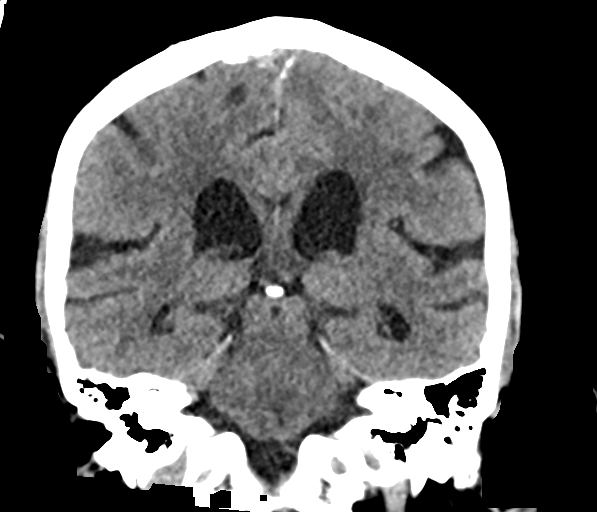
[im 39/70  brain]
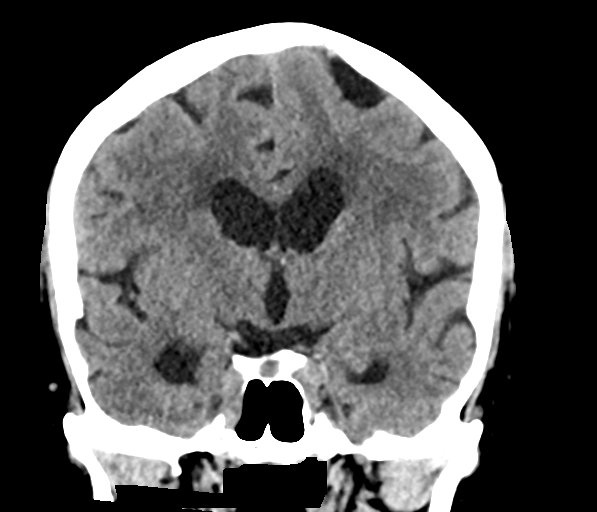

[Series 6: sag soft · sagittal · 0.31mm/px · 3 of 57 slices shown]
[im 19/57  brain]
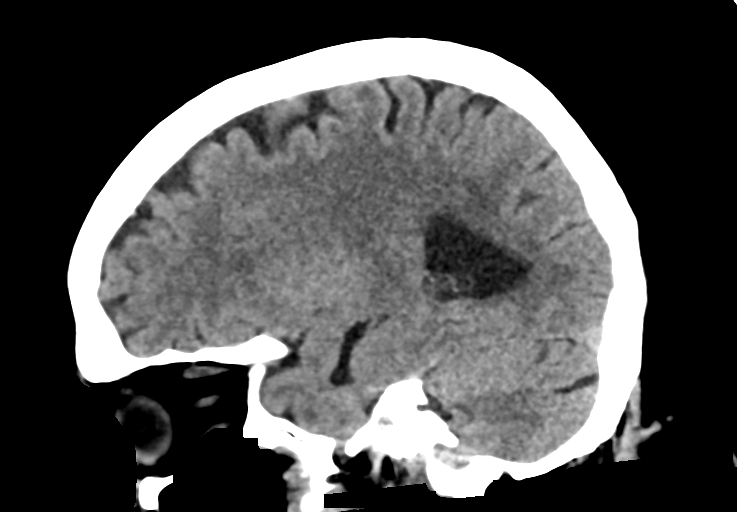
[im 29/57  brain]
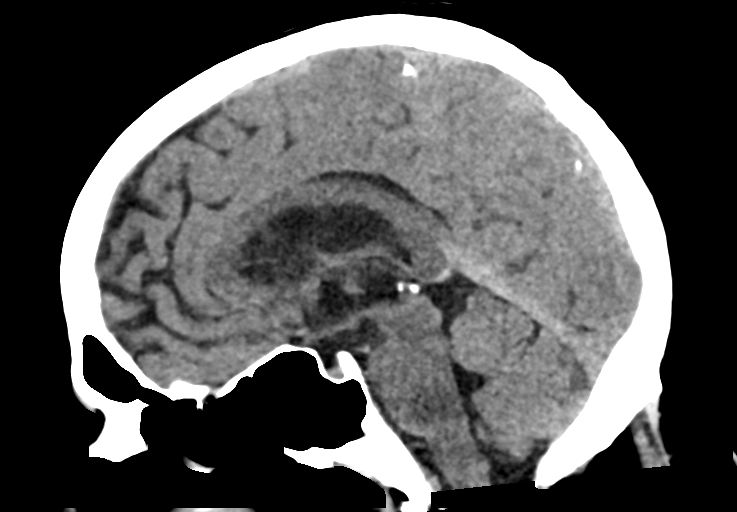
[im 38/57  brain]
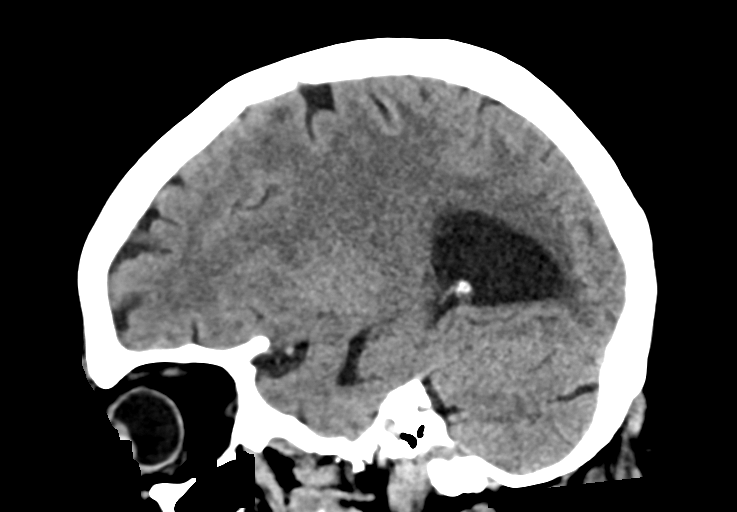

[16 of 47 positions shown; findings below may reference images not displayed]

FINDINGS: Brain: There is no evidence for acute hemorrhage, hydrocephalus,
mass lesion, or abnormal extra-axial fluid collection. No definite
CT evidence for acute infarction. Diffuse loss of parenchymal volume
is consistent with atrophy. Patchy low attenuation in the deep
hemispheric and periventricular white matter is nonspecific, but
likely reflects chronic microvascular ischemic demyelination.

Vascular: No hyperdense vessel or unexpected calcification.

Skull: No evidence for fracture. No worrisome lytic or sclerotic
lesion.

Sinuses/Orbits: The visualized paranasal sinuses and mastoid air
cells are clear. Visualized portions of the globes and intraorbital
fat are unremarkable.

Other: None.
IMPRESSION: 1. No acute intracranial abnormality.
2. Atrophy with chronic small vessel white matter ischemic disease.

## 2022-08-09 ENCOUNTER — Encounter: Payer: Self-pay | Admitting: Cardiology

## 2022-08-09 NOTE — Telephone Encounter (Signed)
error 

## 2022-08-13 DIAGNOSIS — M545 Low back pain, unspecified: Secondary | ICD-10-CM | POA: Diagnosis not present

## 2022-08-13 DIAGNOSIS — R269 Unspecified abnormalities of gait and mobility: Secondary | ICD-10-CM | POA: Diagnosis not present

## 2022-09-10 DIAGNOSIS — M545 Low back pain, unspecified: Secondary | ICD-10-CM | POA: Diagnosis not present

## 2022-09-10 DIAGNOSIS — R269 Unspecified abnormalities of gait and mobility: Secondary | ICD-10-CM | POA: Diagnosis not present

## 2022-09-29 ENCOUNTER — Other Ambulatory Visit: Payer: Self-pay | Admitting: Internal Medicine

## 2022-10-11 DIAGNOSIS — M545 Low back pain, unspecified: Secondary | ICD-10-CM | POA: Diagnosis not present

## 2022-10-11 DIAGNOSIS — R269 Unspecified abnormalities of gait and mobility: Secondary | ICD-10-CM | POA: Diagnosis not present

## 2022-11-01 DIAGNOSIS — M545 Low back pain, unspecified: Secondary | ICD-10-CM | POA: Diagnosis not present

## 2022-11-01 DIAGNOSIS — R269 Unspecified abnormalities of gait and mobility: Secondary | ICD-10-CM | POA: Diagnosis not present

## 2022-11-08 NOTE — Progress Notes (Signed)
Annual Wellness Visit    Patient Care Team: Margaree Mackintosh, MD as PCP - General (Internal Medicine)  Visit Date: 11/22/22   Chief Complaint  Patient presents with   Medicare Annual Wellness    Subjective:   Patient: Jimmy [name], Male    DOB: 02-Dec-1940, 82 y.o.   MRN: [id number]  Jimmy [name] is a 82 y.o. Male who presents today for his Annual Wellness Visit.Also presents for health maintenance exam and evaluation of medical issues.  History of anxiety and depression treated with fluoxetine 20 mg daily.  Takes alprazolam 0.25 - 0.5 mg three times daily as needed for Vertigo.  History of hyperlipidemia treated with simvastatin 20 mg at bedtime. Lipid panel normal.  History of hypertension treated with hydrochlorothiazide 25 mg daily, trandolapril 2 mg daily, amlodipine 5 mg daily. Blood pressure normal today at 130/82.  History of gout treated with allopurinol 300 mg daily.    Has annual eye exam with [facility].  Glucose elevated at 107. Kidney, liver functions normal. Electrolytes normal. Blood proteins normal. CBC normal. PSA at 0.49.  Had colonoscopy in 2014 which was normal and 10-year follow-up recommended.   Social history: Divorced.  Does not smoke.  Social alcohol consumption.  He has adult children.  He is Psychologist, counselling.  Family history: Father with history of coronary disease, diabetes, gout status post CABG deceased with an acute coronary event.  Father had gout and alcohol abuse.  Mother with history of stroke and hypertension died suddenly with an acute arrest perhaps stroke or MI.  1 sister died in motor vehicle accident.  2 brothers-one of them with history of lymphoma.  2 sisters in good health.  Past Medical History:  Diagnosis Date   Depression    Hyperlipidemia    Hypertension      Family History  Problem Relation Age of Onset   Stroke Mother    Hypertension Mother    Heart disease Father 76       CABG    Alcohol abuse Father    Cancer Brother      Social History   Social History Narrative   Divorced.  Does not smoke.  Social alcohol consumption.  Has adult children.  He is Psychologist, counselling.         Review of Systems  Constitutional:  Negative for chills, fever, malaise/fatigue and weight loss.  HENT:  Negative for hearing loss, sinus pain and sore throat.   Respiratory:  Negative for cough, hemoptysis and shortness of breath.   Cardiovascular:  Negative for chest pain, palpitations, leg swelling and PND.  Gastrointestinal:  Negative for abdominal pain, constipation, diarrhea, heartburn, nausea and vomiting.  Genitourinary:  Negative for dysuria, frequency and urgency.  Musculoskeletal:  Negative for back pain, myalgias and neck pain.  Skin:  Negative for itching and rash.  Neurological:  Negative for dizziness, tingling, seizures and headaches.  Endo/Heme/Allergies:  Negative for polydipsia.  Psychiatric/Behavioral:  Negative for depression. The patient is not nervous/anxious.       Objective:   Vitals: BP 130/82 (BP Location: Right Arm, Patient Position: Sitting, Cuff Size: Large)   Pulse 73   Temp (!) 97.5 F (36.4 C) (Tympanic)   Ht 5' 6.5" (1.689 m)   Wt 229 lb (103.9 kg)   SpO2 98%   BMI 36.41 kg/m   Physical Exam Vitals and nursing note reviewed.  Constitutional:      General:  He is awake. He is not in acute distress.    Appearance: Normal appearance. He is not ill-appearing or toxic-appearing.  HENT:     Head: Normocephalic and atraumatic.     Right Ear: Tympanic membrane, ear canal and external ear normal.     Left Ear: Tympanic membrane, ear canal and external ear normal.     Mouth/Throat:     Pharynx: Oropharynx is clear.  Eyes:     Extraocular Movements: Extraocular movements intact.     Pupils: Pupils are equal, round, and reactive to light.  Neck:     Thyroid: No thyroid mass, thyromegaly or thyroid tenderness.      Vascular: No carotid bruit.  Cardiovascular:     Rate and Rhythm: Normal rate and regular rhythm. No extrasystoles are present.    Pulses:          Dorsalis pedis pulses are 2+ on the right side and 2+ on the left side.     Heart sounds: Normal heart sounds. No murmur heard.    No friction rub. No gallop.  Pulmonary:     Effort: Pulmonary effort is normal.     Breath sounds: Normal breath sounds. No decreased breath sounds, wheezing, rhonchi or rales.  Chest:     Chest wall: No mass.  Abdominal:     Palpations: Abdomen is soft.     Tenderness: There is no abdominal tenderness.     Hernia: No hernia is present.  Genitourinary:    Prostate: Normal. Not enlarged and no nodules present.     Comments: Prostate smooth and symmetrical. Musculoskeletal:     Cervical back: Normal range of motion.     Right lower leg: 1+ Edema present.     Left lower leg: 1+ Edema present.  Lymphadenopathy:     Cervical: No cervical adenopathy.     Upper Body:     Right upper body: No supraclavicular adenopathy.     Left upper body: No supraclavicular adenopathy.  Skin:    General: Skin is warm and dry.  Neurological:     General: No focal deficit present.     Mental Status: He is alert and oriented to person, place, and time. Mental status is at baseline.     Cranial Nerves: Cranial nerves 2-12 are intact.     Sensory: Sensation is intact.     Motor: Motor function is intact.     Coordination: Coordination is intact.     Gait: Gait is intact.     Deep Tendon Reflexes: Reflexes are normal and symmetric.  Psychiatric:        Attention and Perception: Attention normal.        Mood and Affect: Mood normal.        Speech: Speech normal.        Behavior: Behavior normal. Behavior is cooperative.        Thought Content: Thought content normal.        Cognition and Memory: Cognition and memory normal.        Judgment: Judgment normal.      Most recent functional status assessment:     No data to  display         Most recent fall risk assessment:    05/17/2022    9:35 AM  Fall Risk   Falls in the past year? 1  Number falls in past yr: 0  Injury with Fall? 0  Risk for fall due to : Other (Comment)  Follow  up Falls prevention discussed    Most recent depression screenings:    05/17/2022    9:35 AM 03/13/2022    2:39 PM  PHQ 2/9 Scores  PHQ - 2 Score 0 0   Most recent cognitive screening:    11/14/2021   11:03 AM  6CIT Screen  What Year? 0 points  What month? 0 points  What time? 0 points  Count back from 20 0 points  Months in reverse 0 points  Repeat phrase 0 points  Total Score 0 points     Results:   Studies obtained and personally reviewed by me:  Had colonoscopy in 2014 which was normal and 10-year follow-up recommended.  Labs:       Component Value Date/Time   NA 138 11/20/2022 0911   K 4.6 11/20/2022 0911   CL 100 11/20/2022 0911   CO2 30 11/20/2022 0911   GLUCOSE 107 (H) 11/20/2022 0911   BUN 17 11/20/2022 0911   CREATININE 0.87 11/20/2022 0911   CALCIUM 9.6 11/20/2022 0911   PROT 6.6 11/20/2022 0911   ALBUMIN 3.5 03/17/2020 1330   AST 18 11/20/2022 0911   ALT 19 11/20/2022 0911   ALKPHOS 48 03/17/2020 1330   BILITOT 0.7 11/20/2022 0911   GFRNONAA >60 03/17/2020 1330   GFRNONAA 73 11/05/2019 0918   GFRAA 85 11/05/2019 0918     Lab Results  Component Value Date   WBC 4.5 11/20/2022   HGB 14.8 11/20/2022   HCT 44.1 11/20/2022   MCV 96.9 11/20/2022   PLT 195 11/20/2022    Lab Results  Component Value Date   CHOL 157 11/20/2022   HDL 64 11/20/2022   LDLCALC 75 11/20/2022   TRIG 97 11/20/2022   CHOLHDL 2.5 11/20/2022    No results found for: "HGBA1C"   No results found for: "TSH"   Lab Results  Component Value Date   PSA 0.49 11/20/2022   PSA 0.56 11/09/2021   PSA 0.57 11/01/2020    Assessment & Plan:   Anxiety and depression: stable with fluoxetine 20 mg daily.  Vertigo: treated with alprazolam 0.25 - 0.5 mg  three times daily as needed.  Hyperlipidemia: treated with simvastatin 20 mg at bedtime. Lipid panel normal.  Hypertension: treated with hydrochlorothiazide 25 mg daily, trandolapril 2 mg daily, amlodipine 5 mg daily. Blood pressure normal today at 130/82.  Gout: treated with allopurinol 300 mg daily.   Urinalysis normal.  Had colonoscopy in 2014 which was normal. Referral to GI.  Hx of low back pain improved with physical therapy at [facility]  Vaccine counseling: UTD on tetanus, pneumococcal 20 vaccines. Will get a flu update in the Fall. He will look for RSV record. Discussed Covid-19 booster.  Return in 6 months for follow up on medical issues.     Annual wellness visit done today including the all of the following: Reviewed patient's Family Medical History Reviewed and updated list of patient's medical providers Assessment of cognitive impairment was done Assessed patient's functional ability Established a written schedule for health screening services Health Risk Assessent Completed and Reviewed  Discussed health benefits of physical activity, and encouraged him to engage in regular exercise appropriate for his age and condition.        I,Alexander Ruley,acting as a Neurosurgeon for Margaree Mackintosh, MD.,have documented all relevant documentation on the behalf of Margaree Mackintosh, MD,as directed by  Margaree Mackintosh, MD while in the presence of Margaree Mackintosh, MD.   Loura Back  Lenord Fellers, MD, have reviewed all documentation for this visit. The documentation on 12/08/22 for the exam, diagnosis, procedures, and orders are all accurate and complete.

## 2022-11-19 ENCOUNTER — Other Ambulatory Visit: Payer: Self-pay

## 2022-11-19 DIAGNOSIS — I10 Essential (primary) hypertension: Secondary | ICD-10-CM

## 2022-11-19 DIAGNOSIS — E7800 Pure hypercholesterolemia, unspecified: Secondary | ICD-10-CM

## 2022-11-19 DIAGNOSIS — Z125 Encounter for screening for malignant neoplasm of prostate: Secondary | ICD-10-CM

## 2022-11-20 ENCOUNTER — Other Ambulatory Visit: Payer: Medicare Other

## 2022-11-20 DIAGNOSIS — E7800 Pure hypercholesterolemia, unspecified: Secondary | ICD-10-CM

## 2022-11-20 DIAGNOSIS — I10 Essential (primary) hypertension: Secondary | ICD-10-CM

## 2022-11-20 DIAGNOSIS — Z125 Encounter for screening for malignant neoplasm of prostate: Secondary | ICD-10-CM

## 2022-11-20 NOTE — Progress Notes (Signed)
Lab only 

## 2022-11-22 ENCOUNTER — Ambulatory Visit (INDEPENDENT_AMBULATORY_CARE_PROVIDER_SITE_OTHER): Payer: Medicare Other | Admitting: Internal Medicine

## 2022-11-22 ENCOUNTER — Encounter: Payer: Self-pay | Admitting: Internal Medicine

## 2022-11-22 VITALS — BP 130/82 | HR 73 | Temp 97.5°F | Ht 66.5 in | Wt 229.0 lb

## 2022-11-22 DIAGNOSIS — Z8739 Personal history of other diseases of the musculoskeletal system and connective tissue: Secondary | ICD-10-CM

## 2022-11-22 DIAGNOSIS — Z8249 Family history of ischemic heart disease and other diseases of the circulatory system: Secondary | ICD-10-CM | POA: Diagnosis not present

## 2022-11-22 DIAGNOSIS — Z125 Encounter for screening for malignant neoplasm of prostate: Secondary | ICD-10-CM

## 2022-11-22 DIAGNOSIS — Z0000 Encounter for general adult medical examination without abnormal findings: Secondary | ICD-10-CM

## 2022-11-22 DIAGNOSIS — E7800 Pure hypercholesterolemia, unspecified: Secondary | ICD-10-CM

## 2022-11-22 DIAGNOSIS — Z6836 Body mass index (BMI) 36.0-36.9, adult: Secondary | ICD-10-CM

## 2022-11-22 DIAGNOSIS — I10 Essential (primary) hypertension: Secondary | ICD-10-CM

## 2022-11-22 LAB — POC URINALSYSI DIPSTICK (AUTOMATED)
Bilirubin, UA: NEGATIVE
Blood, UA: NEGATIVE
Glucose, UA: NEGATIVE
Ketones, UA: NEGATIVE
Leukocytes, UA: NEGATIVE
Nitrite, UA: NEGATIVE
Protein, UA: NEGATIVE
Spec Grav, UA: <=1.005 — AB (ref 1.010–1.025)
Urobilinogen, UA: 0.2 E.U./dL
pH, UA: 6.5 (ref 5.0–8.0)

## 2022-11-26 DIAGNOSIS — M545 Low back pain, unspecified: Secondary | ICD-10-CM | POA: Diagnosis not present

## 2022-11-26 DIAGNOSIS — R269 Unspecified abnormalities of gait and mobility: Secondary | ICD-10-CM | POA: Diagnosis not present

## 2022-11-28 ENCOUNTER — Encounter: Payer: Self-pay | Admitting: Internal Medicine

## 2022-11-28 NOTE — Telephone Encounter (Signed)
Patient reports RSV vaccine at [facility] Apr 23 2022

## 2022-12-08 NOTE — Patient Instructions (Addendum)
It was a pleasure to see you today as always.  Continue current medications.  Consider COVID booster this Fall in addition to flu vaccine.  Consider RSV vaccine.  Tetanus vaccine is up-to-date as is pneumococcal 20 vaccine.  It is time to have follow-up colonoscopy.  Referral will be made.  We will see you again in 6 months.  Please call if you have any questions or concerns.

## 2022-12-25 DIAGNOSIS — M545 Low back pain, unspecified: Secondary | ICD-10-CM | POA: Diagnosis not present

## 2022-12-25 DIAGNOSIS — R269 Unspecified abnormalities of gait and mobility: Secondary | ICD-10-CM | POA: Diagnosis not present

## 2022-12-26 ENCOUNTER — Telehealth: Payer: Self-pay | Admitting: Internal Medicine

## 2022-12-26 NOTE — Telephone Encounter (Signed)
-------  Fax Transmission Report-------  To:               Recipient at [phone] Subject:          Fw: Hp Scans Result:           The transmission was successful. Explanation:      All Pages Ok Pages Sent:       4 Connect Time:     2 minutes, 5 seconds Transmit Time:    12/25/2022 11:42 Transfer Rate:    14400 Status Code:      0000 Retry Count:      2 Job Id:           [id number] Unique Id:        [id number] Fax Line:         16 Fax Server:       [id number]

## 2022-12-26 NOTE — Telephone Encounter (Signed)
Faxed, reviewed and  signed Rehab orders to [facility] [phone], phone [phone]

## 2022-12-28 ENCOUNTER — Other Ambulatory Visit: Payer: Self-pay | Admitting: Internal Medicine

## 2023-01-03 ENCOUNTER — Encounter: Payer: Self-pay | Admitting: Internal Medicine

## 2023-01-03 ENCOUNTER — Ambulatory Visit (INDEPENDENT_AMBULATORY_CARE_PROVIDER_SITE_OTHER): Payer: Medicare Other | Admitting: Internal Medicine

## 2023-01-03 VITALS — BP 110/70 | HR 91 | Temp 98.0°F | Ht 66.5 in | Wt 229.0 lb

## 2023-01-03 DIAGNOSIS — Z23 Encounter for immunization: Secondary | ICD-10-CM

## 2023-01-22 DIAGNOSIS — R269 Unspecified abnormalities of gait and mobility: Secondary | ICD-10-CM | POA: Diagnosis not present

## 2023-01-22 DIAGNOSIS — M545 Low back pain, unspecified: Secondary | ICD-10-CM | POA: Diagnosis not present

## 2023-02-12 ENCOUNTER — Telehealth: Payer: Self-pay | Admitting: Internal Medicine

## 2023-02-12 NOTE — Telephone Encounter (Signed)
Patient called and said he needs an updated referral sent to [facility] at the [address] address Phone: [phone]

## 2023-02-14 ENCOUNTER — Encounter: Payer: Self-pay | Admitting: Internal Medicine

## 2023-02-14 NOTE — Telephone Encounter (Signed)
I am not sure what referral he is referring to?

## 2023-02-14 NOTE — Telephone Encounter (Signed)
Written prescription for [facility] for pt to receive treatment for low back pain as indicated. MJB, MD

## 2023-02-19 DIAGNOSIS — R269 Unspecified abnormalities of gait and mobility: Secondary | ICD-10-CM | POA: Diagnosis not present

## 2023-02-19 DIAGNOSIS — M545 Low back pain, unspecified: Secondary | ICD-10-CM | POA: Diagnosis not present

## 2023-03-06 ENCOUNTER — Encounter: Payer: Self-pay | Admitting: Internal Medicine

## 2023-03-19 DIAGNOSIS — M545 Low back pain, unspecified: Secondary | ICD-10-CM | POA: Diagnosis not present

## 2023-03-19 DIAGNOSIS — R269 Unspecified abnormalities of gait and mobility: Secondary | ICD-10-CM | POA: Diagnosis not present

## 2023-03-29 ENCOUNTER — Other Ambulatory Visit: Payer: Self-pay | Admitting: Internal Medicine

## 2023-04-09 DIAGNOSIS — M545 Low back pain, unspecified: Secondary | ICD-10-CM | POA: Diagnosis not present

## 2023-04-09 DIAGNOSIS — R269 Unspecified abnormalities of gait and mobility: Secondary | ICD-10-CM | POA: Diagnosis not present

## 2023-05-06 ENCOUNTER — Telehealth: Payer: Self-pay | Admitting: Internal Medicine

## 2023-05-06 NOTE — Telephone Encounter (Signed)
-------  Fax Transmission Report-------  To:               Recipient at [phone] Subject:          Fw: Hp Scans Result:           The transmission was successful. Explanation:      All Pages Ok Pages Sent:       4 Connect Time:     1 minutes, 47 seconds Transmit Time:    05/06/2023 16:30 Transfer Rate:    14400 Status Code:      0000 Retry Count:      0 Job Id:           [id number] Unique Id:        [id number] Fax Line:         24 Fax Server:       [employer]

## 2023-05-06 NOTE — Telephone Encounter (Signed)
 Received orders from [facility] today to go over and sign, fax back to [phone] Progress note dated 04/09/2023

## 2023-05-07 DIAGNOSIS — R269 Unspecified abnormalities of gait and mobility: Secondary | ICD-10-CM | POA: Diagnosis not present

## 2023-05-07 DIAGNOSIS — M545 Low back pain, unspecified: Secondary | ICD-10-CM | POA: Diagnosis not present

## 2023-05-23 ENCOUNTER — Other Ambulatory Visit: Payer: Medicare Other

## 2023-05-23 DIAGNOSIS — E7800 Pure hypercholesterolemia, unspecified: Secondary | ICD-10-CM | POA: Diagnosis not present

## 2023-05-24 LAB — LIPID PANEL
Cholesterol: 166 mg/dL (ref <200)
HDL: 65 mg/dL (ref >=40)
LDL Cholesterol (Calc): 81 mg/dL
Non-HDL Cholesterol (Calc): 101 mg/dL (ref <130)
Total CHOL/HDL Ratio: 2.6 (calc) (ref <5.0)
Triglycerides: 107 mg/dL (ref <150)

## 2023-05-24 NOTE — Progress Notes (Addendum)
 Patient Care Team: Perri Ronal PARAS, MD as PCP - General (Internal Medicine)  Visit Date: 05/28/23  Subjective:   Chief Complaint  Patient presents with   Hyperlipidemia   Hypertension   Patient PI:Jimmy [name],Male DOB:11-03-1940,82 y.o. FMW:[id number]   82 y.o. Male presents today for 6 months follow-up for HTN & HLD. Patient has a past medical history of Obesity w/BMI <35.0, Depression, & Gout. Last seen 11/22/22 for his annual visit, in the interim was seen monthly from August-October at [facility] for gait & mobility abnormalities, unspecified; low back pain, unspecified. Flu vaccination updated 01/03/23.   He reports that his hips, bilaterally, have been bothering him for the past 2-3 months. Has decreased walking and exercise. Endorses tenderness on palpation to hip joint and pain when walking. Expresses that he would not like an x-ray currently as he would like to try increasing his exercise to try and relieve his pain. Denied pain during Lasegue's test.   History of Hypertension treated with 5 mg Amlodipine  daily, 2 mg Trandolapril  daily, and 25 mg Hydrochlorothiazide  daily. Blood Pressure: normotensive at 130/80  History of Hyperlipidemia treated with 20 mg Simvastatin  nightly. 05/23/23 Lipid Panel WNL.   History of Obesity with current weight of 232, BMI 36.88  History of Gout treated with 300 mg Allopurinol  daily.   History of Depression treated with 20 mg Prozac  daily.   No Known Allergies  Past Medical History:  Diagnosis Date   Depression    Hyperlipidemia    Hypertension     Family History  Problem Relation Age of Onset   Stroke Mother    Hypertension Mother    Heart disease Father 11       CABG   Alcohol abuse Father    Cancer Brother    Social History   Social History Narrative   Divorced.  Does not smoke.  Social alcohol consumption.  Has adult children.  He is Psychologist, Counselling.       Review of Systems   Constitutional:  Negative for fever and malaise/fatigue.  HENT:  Negative for congestion.   Eyes:  Negative for blurred vision.  Respiratory:  Negative for cough and shortness of breath.   Cardiovascular:  Negative for chest pain, palpitations and leg swelling.  Gastrointestinal:  Negative for vomiting.  Musculoskeletal:  Negative for back pain.  Skin:  Negative for rash.  Neurological:  Negative for loss of consciousness and headaches.     Objective:  Vitals: BP 130/80   Pulse 72   Ht 5' 6.5 (1.689 m)   Wt 232 lb (105.2 kg)   SpO2 97%   BMI 36.88 kg/m   Physical Exam Constitutional:      General: He is not in acute distress.    Appearance: Normal appearance. He is not ill-appearing.  HENT:     Head: Normocephalic and atraumatic.  Cardiovascular:     Rate and Rhythm: Normal rate and regular rhythm.     Pulses: Normal pulses.     Heart sounds: Normal heart sounds. No murmur heard.    No friction rub. No gallop.  Pulmonary:     Effort: Pulmonary effort is normal. No respiratory distress.     Breath sounds: Normal breath sounds. No wheezing or rales.  Musculoskeletal:     Right hip: Tenderness present.     Left hip: Tenderness present.     Right knee: No tenderness.     Left knee: No tenderness.  Comments: No pain during Lasegue's test (leg raised, knee bent, hip abducted & adducted) Tenderness to trochanter on palpation  Skin:    General: Skin is warm and dry.  Neurological:     Mental Status: He is alert and oriented to person, place, and time. Mental status is at baseline.  Psychiatric:        Mood and Affect: Mood normal.        Behavior: Behavior normal.        Thought Content: Thought content normal.        Judgment: Judgment normal.     Results:  Studies Obtained And Personally Reviewed By Me: Labs:     Component Value Date/Time   NA 138 11/20/2022 0911   K 4.6 11/20/2022 0911   CL 100 11/20/2022 0911   CO2 30 11/20/2022 0911   GLUCOSE 107 (H)  11/20/2022 0911   BUN 17 11/20/2022 0911   CREATININE 0.87 11/20/2022 0911   CALCIUM 9.6 11/20/2022 0911   PROT 6.6 11/20/2022 0911   ALBUMIN 3.5 03/17/2020 1330   AST 18 11/20/2022 0911   ALT 19 11/20/2022 0911   ALKPHOS 48 03/17/2020 1330   BILITOT 0.7 11/20/2022 0911   GFRNONAA >60 03/17/2020 1330   GFRNONAA 73 11/05/2019 0918   GFRAA 85 11/05/2019 0918    Lab Results  Component Value Date   WBC 4.5 11/20/2022   HGB 14.8 11/20/2022   HCT 44.1 11/20/2022   MCV 96.9 11/20/2022   PLT 195 11/20/2022   Lab Results  Component Value Date   CHOL 166 05/23/2023   HDL 65 05/23/2023   LDLCALC 81 05/23/2023   TRIG 107 05/23/2023   CHOLHDL 2.6 05/23/2023   Lab Results  Component Value Date   PSA 0.49 11/20/2022   PSA 0.56 11/09/2021   PSA 0.57 11/01/2020   Assessment & Plan:   Trochanteric Bursitis, Left Hip: pt will try to increase his exercise and walking to relieve this issue. Recommended using ice or heat, whichever relieves the pain more, on tender area. Sending in 15 mg Mobic  - take 1 tablet (15 mg total) by mouth daily.    Hypertension treated with 5 mg Amlodipine  daily, 2 mg Trandolapril  daily, and 25 mg Hydrochlorothiazide  daily. Blood Pressure: normotensive at 130/80  Hyperlipidemia treated with 20 mg Simvastatin  nightly. 05/23/23 Lipid Panel WNL.   Obesity with current weight of 232, BMI 36.88  Gout treated with 300 mg Allopurinol  daily.   Depression treated with 20 mg Prozac  daily.   Plan: return for wellness visit in 6 months.  I,Emily Lagle,acting as a neurosurgeon for [name], MD.,have documented all relevant documentation on the behalf of [name], MD,as directed by  [name], MD while in the presence of [name], MD.   I, [name], MD, have reviewed all documentation for this visit. The documentation on 05/28/23 for the exam, diagnosis, procedures, and orders are all accurate and complete.

## 2023-05-28 ENCOUNTER — Ambulatory Visit: Payer: Medicare Other | Admitting: Internal Medicine

## 2023-05-28 ENCOUNTER — Encounter: Payer: Self-pay | Admitting: Internal Medicine

## 2023-05-28 VITALS — BP 130/80 | HR 72 | Ht 66.5 in | Wt 232.0 lb

## 2023-05-28 DIAGNOSIS — Z8739 Personal history of other diseases of the musculoskeletal system and connective tissue: Secondary | ICD-10-CM | POA: Diagnosis not present

## 2023-05-28 DIAGNOSIS — E7800 Pure hypercholesterolemia, unspecified: Secondary | ICD-10-CM | POA: Diagnosis not present

## 2023-05-28 DIAGNOSIS — I10 Essential (primary) hypertension: Secondary | ICD-10-CM

## 2023-05-28 DIAGNOSIS — Z6836 Body mass index (BMI) 36.0-36.9, adult: Secondary | ICD-10-CM

## 2023-05-28 DIAGNOSIS — M7062 Trochanteric bursitis, left hip: Secondary | ICD-10-CM

## 2023-05-28 MED ORDER — MELOXICAM 15 MG PO TABS: 15.0000 mg | ORAL_TABLET | Freq: Every day | ORAL | 2 refills | Status: DC

## 2023-06-04 DIAGNOSIS — M545 Low back pain, unspecified: Secondary | ICD-10-CM | POA: Diagnosis not present

## 2023-06-04 DIAGNOSIS — R269 Unspecified abnormalities of gait and mobility: Secondary | ICD-10-CM | POA: Diagnosis not present

## 2023-06-09 ENCOUNTER — Encounter: Payer: Self-pay | Admitting: Internal Medicine

## 2023-06-09 NOTE — Patient Instructions (Addendum)
 It was a pleasure to see you today.Try Mobic daily for trochanteric bursitis. Continue meds for hypertension. Try to lose som weight. Continue Prozac and Allopurinol. Wellness visit due in 6 months.

## 2023-06-18 ENCOUNTER — Telehealth: Payer: Self-pay | Admitting: Internal Medicine

## 2023-06-18 NOTE — Telephone Encounter (Signed)
 Received Physical Therapy Progress notes and Plan of Care to go over and fax back to [phone] or [phone] phone [phone]

## 2023-06-18 NOTE — Telephone Encounter (Signed)
-------  Fax Transmission Report-------  To:               Recipient at [phone] Subject:          Fw: Hp Scans Result:           The transmission was successful. Explanation:      All Pages Ok Pages Sent:       4 Connect Time:     2 minutes, 13 seconds Transmit Time:    06/18/2023 16:11 Transfer Rate:    14400 Status Code:      0000 Retry Count:      0 Job Id:           [id number] Unique Id:        [id number] Fax Line:         8 Fax Server:       [employer]

## 2023-07-01 DIAGNOSIS — H02831 Dermatochalasis of right upper eyelid: Secondary | ICD-10-CM | POA: Diagnosis not present

## 2023-07-01 DIAGNOSIS — H02834 Dermatochalasis of left upper eyelid: Secondary | ICD-10-CM | POA: Diagnosis not present

## 2023-07-01 DIAGNOSIS — H40033 Anatomical narrow angle, bilateral: Secondary | ICD-10-CM | POA: Diagnosis not present

## 2023-07-01 DIAGNOSIS — H25813 Combined forms of age-related cataract, bilateral: Secondary | ICD-10-CM | POA: Diagnosis not present

## 2023-07-01 DIAGNOSIS — H43811 Vitreous degeneration, right eye: Secondary | ICD-10-CM | POA: Diagnosis not present

## 2023-07-15 ENCOUNTER — Other Ambulatory Visit (HOSPITAL_COMMUNITY): Payer: Self-pay | Admitting: Cardiology

## 2023-07-15 ENCOUNTER — Ambulatory Visit (HOSPITAL_COMMUNITY)
Admission: RE | Admit: 2023-07-15 | Discharge: 2023-07-15 | Disposition: A | Discharge status: Home / Self Care | Source: Ambulatory Visit | Attending: Cardiology | Admitting: Cardiology

## 2023-07-15 ENCOUNTER — Other Ambulatory Visit: Payer: Self-pay

## 2023-07-15 DIAGNOSIS — I6523 Occlusion and stenosis of bilateral carotid arteries: Secondary | ICD-10-CM

## 2023-07-19 ENCOUNTER — Other Ambulatory Visit: Payer: Self-pay

## 2023-07-19 DIAGNOSIS — I6523 Occlusion and stenosis of bilateral carotid arteries: Secondary | ICD-10-CM

## 2023-09-27 ENCOUNTER — Other Ambulatory Visit: Payer: Self-pay | Admitting: Internal Medicine

## 2023-10-30 DIAGNOSIS — T1500XA Foreign body in cornea, unspecified eye, initial encounter: Secondary | ICD-10-CM | POA: Diagnosis not present

## 2023-11-22 ENCOUNTER — Other Ambulatory Visit: Payer: Medicare Other

## 2023-11-22 DIAGNOSIS — E7800 Pure hypercholesterolemia, unspecified: Secondary | ICD-10-CM | POA: Diagnosis not present

## 2023-11-22 DIAGNOSIS — Z0000 Encounter for general adult medical examination without abnormal findings: Secondary | ICD-10-CM

## 2023-11-22 DIAGNOSIS — Z6836 Body mass index (BMI) 36.0-36.9, adult: Secondary | ICD-10-CM

## 2023-11-22 DIAGNOSIS — I10 Essential (primary) hypertension: Secondary | ICD-10-CM

## 2023-11-22 DIAGNOSIS — Z125 Encounter for screening for malignant neoplasm of prostate: Secondary | ICD-10-CM

## 2023-11-22 NOTE — Progress Notes (Signed)
 Annual Wellness Visit   Patient Care Team: Perri Ronal PARAS, MD as PCP - General (Internal Medicine)  Visit Date: 11/25/23   Chief Complaint  Patient presents with   Annual Exam   Medicare Wellness   Subjective:  Patient: Jimmy [name], Male DOB: 1940-05-12, 83 y.o. MRN: [id number]  Jimmy [name] is a 83 y.o. Male who presents today for his Annual Wellness Visit. Patient has Hyperlipidemia; Hypertension; Depression; Erectile Dysfunction; Gout; Obesity; Vertigo; Family History of Early CAD; SOB (Shortness Of Breath); and Bruit.  History of Hypertension treated with Amlodipine  5 mg daily and Trandolapril  2 mg daily. Blood Pressure: normotensive today at 110/60.    History of Hyperlipidemia treated with Simvastatin  20 mg daily. 11/22/2023 Lipid Panel: WNL.   History of Obesity; BMI 35+, today weighs 226BMI 36.20, a 6 pounds loss from when he was last seen in February and weighed 232 lbs.  History of Anxiety/Depression treated with Prozac  20 mg daily, and also is prescribed Xanax  0.25-0.5 mg up to three times daily as needed.  History of Vertigo treated with Xanax  0.25-0.5 mg up to three times daily as needed   History of Gout treated with Allopurinol  300 mg daily and Meloxicam  15 mg daily.  History of Vitamin-D Deficiency treated with Vitamin-D 1000 units daily  Labs 11/22/2023 CBC: WNL C-MET, compared to 10/2022: Chloride 97, decreased from 100; Glucose 105, decreased from 107; otherwise WNL.   Colonoscopy 05/27/2012 normal with w/o repeat recommendation due to age.  PSA  0.66  11/22/2023  Vaccine Counseling: Due for Influenza; UTD on Shingrix 2nd dose, PNA, and Tdap. Review of Systems  Constitutional:  Negative for chills, fever, malaise/fatigue and weight loss.  HENT:  Negative for hearing loss, sinus pain and sore throat.   Respiratory:  Negative for cough, hemoptysis and shortness of breath.   Cardiovascular:  Negative for chest pain, palpitations, leg swelling and PND.   Gastrointestinal:  Negative for abdominal pain, constipation, diarrhea, heartburn, nausea and vomiting.  Genitourinary:  Negative for dysuria, frequency and urgency.  Musculoskeletal:  Negative for back pain, myalgias and neck pain.  Skin:  Negative for itching and rash.  Neurological:  Negative for dizziness, tingling, seizures and headaches.  Endo/Heme/Allergies:  Negative for polydipsia.  Psychiatric/Behavioral:  Negative for depression. The patient is not nervous/anxious.   All other systems reviewed and are negative.  Objective:  Vitals: body mass index is 36.2 kg/m. Today's Vitals   11/25/23 1352  BP: 110/60  Pulse: (!) 55  SpO2: 96%  Weight: 226 lb (102.5 kg)  Height: 5' 6.25 (1.683 m)  PainSc: 0-No pain   Physical Exam Vitals and nursing note reviewed. Exam conducted with a chaperone present Gae Van Horn, CMA).  Constitutional:      General: He is awake. He is not in acute distress.    Appearance: Normal appearance. He is not ill-appearing or toxic-appearing.  HENT:     Head: Normocephalic and atraumatic.     Right Ear: Tympanic membrane, ear canal and external ear normal.     Left Ear: Tympanic membrane, ear canal and external ear normal.     Mouth/Throat:     Pharynx: Oropharynx is clear.  Eyes:     Extraocular Movements: Extraocular movements intact.     Pupils: Pupils are equal, round, and reactive to light.  Neck:     Thyroid: No thyroid mass, thyromegaly or thyroid tenderness.     Vascular: No carotid bruit.  Cardiovascular:     Rate and Rhythm:  Normal rate and regular rhythm. No extrasystoles are present.    Pulses:          Dorsalis pedis pulses are 2+ on the right side and 2+ on the left side.       Posterior tibial pulses are 2+ on the right side and 2+ on the left side.     Heart sounds: Normal heart sounds. No murmur heard.    No friction rub. No gallop.  Pulmonary:     Effort: Pulmonary effort is normal.     Breath sounds: Normal breath  sounds. No decreased breath sounds, wheezing, rhonchi or rales.  Chest:     Chest wall: No mass.  Abdominal:     Palpations: Abdomen is soft. There is no hepatomegaly, splenomegaly or mass.     Tenderness: There is no abdominal tenderness.     Hernia: No hernia is present.  Genitourinary:    Prostate: Normal. Not enlarged, not tender and no nodules present.     Rectum: Normal. Guaiac result negative.  Musculoskeletal:     Cervical back: Normal range of motion.     Right lower leg: No edema.     Left lower leg: No edema.  Lymphadenopathy:     Cervical: No cervical adenopathy.     Upper Body:     Right upper body: No supraclavicular adenopathy.     Left upper body: No supraclavicular adenopathy.  Skin:    General: Skin is warm and dry.  Neurological:     General: No focal deficit present.     Mental Status: He is alert and oriented to person, place, and time. Mental status is at baseline.     Cranial Nerves: Cranial nerves 2-12 are intact.     Sensory: Sensation is intact.     Motor: Motor function is intact.     Coordination: Coordination is intact.     Gait: Gait is intact.     Deep Tendon Reflexes: Reflexes are normal and symmetric.  Psychiatric:        Attention and Perception: Attention normal.        Mood and Affect: Mood normal.        Speech: Speech normal.        Behavior: Behavior normal. Behavior is cooperative.        Thought Content: Thought content normal.        Cognition and Memory: Cognition and memory normal.        Judgment: Judgment normal.    Current Outpatient Medications  Medication Instructions   allopurinol  (ZYLOPRIM ) 300 MG tablet TAKE 1 TABLET(300 MG) BY MOUTH DAILY   ALPRAZolam  (XANAX ) 0.5 MG tablet One half to one tab 3 times daily as needed for vertigo   amLODipine  (NORVASC ) 5 MG tablet TAKE 1 TABLET(5 MG) BY MOUTH DAILY   aspirin 81 mg, Daily   Fiber CAPS 1 tablet, 2 times daily   FLUoxetine  (PROZAC ) 20 MG capsule TAKE 1 CAPSULE(20 MG) BY  MOUTH DAILY   hydrochlorothiazide  (HYDRODIURIL ) 25 MG tablet TAKE 1 TABLET(25 MG) BY MOUTH DAILY   loratadine (CLARITIN) 10 mg, Daily   meloxicam  (MOBIC ) 15 mg, Oral, Daily   sildenafil  (VIAGRA ) 100 MG tablet TAKE ONE TABLET BY MOUTH DAILY AN HOUR BEFORE INTERCOURSE   simvastatin  (ZOCOR ) 20 MG tablet TAKE 1 TABLET(20 MG) BY MOUTH AT BEDTIME   trandolapril  (MAVIK ) 2 MG tablet TAKE 1 TABLET(2 MG) BY MOUTH DAILY   Vitamin D3 1,000 Units, Daily   Past Medical History:  Diagnosis Date   Depression    Hyperlipidemia    Hypertension    Medical/Surgical History Narrative:  Allergic/Intolerant to: No Known Allergies  Past Surgical History:  Procedure Laterality Date   left knee arthroscopy     TONSILLECTOMY     Family History  Problem Relation Age of Onset   Stroke Mother    Hypertension Mother    Heart disease Father 85       CABG   Alcohol abuse Father    Cancer Brother    Family History Narrative: Father, deceased due to an Acute Coronary Event w/ hx of Coronary Disease s/p CABG age 45, Diabetes, Gout, Alcohol Abuse Mother, deceased due to an Acute Arrest (perhaps stroke or MI) w/ hx of Stroke and Hypertension Siblings, 2 Brothers, 3 Sisters - 1 Brother w/ hx of Lymphoma; 1 Sister deceased in a motor vehicle accident; otherwise all in good health as far as is known Social History   Social History Narrative   Divorced.  Does not smoke.  Social alcohol consumption.  Has adult children.  He is Psychologist, counselling.       Most Recent Health Risks Assessment:   Medicare Risk at Home - 11/25/23 1400     Any stairs in or around the home? Yes    If so, are there any without handrails? No    Home free of loose throw rugs in walkways, pet beds, electrical cords, etc? Yes    Adequate lighting in your home to reduce risk of falls? Yes    Life alert? No    Grab bars in the bathroom? Yes    Shower chair or bench in shower? No    Elevated toilet seat or a handicapped  toilet? No         Most Recent Social Determinants of Health (Including Hx of Tobacco, Alcohol, and Drug Use) SDOH Screenings   Food Insecurity: No Food Insecurity (11/25/2023)  Housing: Low Risk  (11/25/2023)  Transportation Needs: No Transportation Needs (11/25/2023)  Utilities: Not At Risk (11/25/2023)  Alcohol Screen: Low Risk  (11/19/2023)  Depression (PHQ2-9): Low Risk  (05/28/2023)  Financial Resource Strain: Low Risk  (11/25/2023)  Physical Activity: Insufficiently Active (11/25/2023)  Social Connections: Socially Isolated (11/25/2023)  Stress: No Stress Concern Present (11/25/2023)  Tobacco Use: Medium Risk (11/25/2023)  Health Literacy: Adequate Health Literacy (11/25/2023)   Social History   Tobacco Use   Smoking status: Former    Current packs/day: 0.00    Average packs/day: 0.5 packs/day for 3.0 years (1.5 ttl pk-yrs)    Types: Cigarettes    Start date: 04/23/1965    Quit date: 04/23/1968    Years since quitting: 55.6    Passive exposure: Never   Smokeless tobacco: Never  Substance Use Topics   Alcohol use: Yes    Alcohol/week: 24.0 standard drinks of alcohol    Types: 24 Cans of beer per week    Comment: daily beer   Drug use: No   Most Recent Functional Status Assessment:    11/25/2023    2:03 PM  In your present state of health, do you have any difficulty performing the following activities:  Hearing? 0  Vision? 0  Difficulty concentrating or making decisions? 0  Walking or climbing stairs? 0  Dressing or bathing? 0  Doing errands, shopping? 0  Preparing Food and eating ? N  Using the Toilet? N  In the past six months, have you accidently leaked urine? N  Do you have problems with loss of bowel control? N  Managing your Medications? N  Managing your Finances? N  Housekeeping or managing your Housekeeping? N   Most Recent Fall Risk Assessment:    11/25/2023    2:00 PM  Fall Risk   Falls in the past year? 0  Number falls in past yr: 0  Injury with Fall? 0  Risk for  fall due to : No Fall Risks  Follow up Falls prevention discussed;Education provided;Falls evaluation completed   Most Recent Anxiety/Depression Screenings:    05/28/2023    9:38 AM 05/17/2022    9:35 AM  PHQ 2/9 Scores  PHQ - 2 Score 0 0   Most Recent Cognitive Screening:    11/25/2023    1:59 PM  6CIT Screen  What Year? 0 points  What month? 0 points  What time? 0 points  Count back from 20 0 points  Months in reverse 0 points  Repeat phrase 0 points  Total Score 0 points   Most Recent Vision/Hearing Screenings: No results found. Results:  Studies Obtained And Personally Reviewed By Me:  Colonoscopy 05/27/2012 normal.  Labs:  CBC w/ Differential Lab Results  Component Value Date   WBC 4.1 11/22/2023   RBC 4.75 11/22/2023   HGB 15.4 11/22/2023   HCT 47.1 11/22/2023   PLT 190 11/22/2023   MCV 99.2 11/22/2023   MCH 32.4 11/22/2023   MCHC 32.7 11/22/2023   RDW 13.0 11/22/2023   MPV 9.9 11/22/2023   LYMPHSABS 1,184 11/20/2022   MONOABS 0.5 03/17/2020   BASOSABS 29 11/22/2023    Comprehensive Metabolic Panel Lab Results  Component Value Date   NA 136 11/22/2023   K 4.5 11/22/2023   CL 97 (L) 11/22/2023   CO2 31 11/22/2023   GLUCOSE 105 (H) 11/22/2023   BUN 19 11/22/2023   CREATININE 0.94 11/22/2023   CALCIUM 9.5 11/22/2023   PROT 6.7 11/22/2023   ALBUMIN 3.5 03/17/2020   AST 18 11/22/2023   ALT 16 11/22/2023   ALKPHOS 48 03/17/2020   BILITOT 0.7 11/22/2023   EGFR 77 11/09/2021   GFRNONAA >60 03/17/2020   Lipid Panel  Lab Results  Component Value Date   CHOL 152 11/22/2023   HDL 63 11/22/2023   LDLCALC 73 11/22/2023   TRIG 75 11/22/2023   PSA Lab Results  Component Value Date   PSA 0.66 11/22/2023   PSA 0.49 11/20/2022   PSA 0.56 11/09/2021     Assessment & Plan:   Meds ordered this encounter  Medications   meloxicam  (MOBIC ) 15 MG tablet    Sig: Take 1 tablet (15 mg total) by mouth daily.    Dispense:  90 tablet    Refill:  3  Other Labs  Reviewed today: CBC: WNL C-MET, compared to 10/2022: Chloride 97, decreased from 100; Glucose 105, decreased from 107; otherwise WNL.   Hypertension treated with Amlodipine  5 mg daily and Trandolapril  2 mg daily. Blood Pressure: normotensive today at 110/60.    Hyperlipidemia treated with Simvastatin  20 mg daily. 11/22/2023 Lipid Panel: WNL.   Obesity; BMI 35+, today weighs 226BMI 36.20, a 6 pounds loss from when he was last seen in February and weighed 232 lbs.  Anxiety/Depression treated with Prozac  20 mg daily, and also is prescribed Xanax  0.25-0.5 mg up to three times daily as needed.  Vertigo treated with Xanax  0.25-0.5 mg up to three times daily as needed   Gout treated with Allopurinol  300 mg daily and Meloxicam   15 mg daily.  Refilling Meloxicam  15 mg.   Vitamin-D Deficiency treated with Vitamin-D 1000 units daily  Colonoscopy 05/27/2012 normal with w/o repeat recommendation due to age.  PSA  0.66  11/22/2023  Vaccine Counseling: Due for Influenza - postponed; UTD on Shingrix 2nd dose, PNA, and Tdap.   Annual Wellness Visit done today including the all of the following: Reviewed patient's Family Medical History Reviewed patient's SDOH and reviewed tobacco, alcohol, and drug use.  Reviewed and updated list of patient's medical providers Assessment of cognitive impairment was done Assessed patient's functional ability Established a written schedule for health screening services Health Risk Assessent Completed and Reviewed  Discussed health benefits of physical activity, and encouraged him to engage in regular exercise appropriate for his age and condition.   I,Emily Lagle,acting as a Neurosurgeon for [name], MD.,have documented all relevant documentation on the behalf of [name], MD,as directed by  [name], MD while in the presence of [name], MD.   I, [name], MD, have reviewed all documentation for this visit. The documentation on 11/25/2023 for the  exam, diagnosis, procedures, and orders are all accurate and complete.

## 2023-11-23 LAB — CBC WITH DIFFERENTIAL/PLATELET
Absolute Lymphocytes: 1226 {cells}/uL (ref 850–3900)
Absolute Monocytes: 504 {cells}/uL (ref 200–950)
Basophils Absolute: 29 {cells}/uL (ref 0–200)
Basophils Relative: 0.7 %
Eosinophils Absolute: 41 {cells}/uL (ref 15–500)
Eosinophils Relative: 1 %
HCT: 47.1 % (ref 38.5–50.0)
Hemoglobin: 15.4 g/dL (ref 13.2–17.1)
MCH: 32.4 pg (ref 27.0–33.0)
MCHC: 32.7 g/dL (ref 32.0–36.0)
MCV: 99.2 fL (ref 80.0–100.0)
MPV: 9.9 fL (ref 7.5–12.5)
Monocytes Relative: 12.3 %
Neutro Abs: 2300 {cells}/uL (ref 1500–7800)
Neutrophils Relative %: 56.1 %
Platelets: 190 Thousand/uL (ref 140–400)
RBC: 4.75 Million/uL (ref 4.20–5.80)
RDW: 13 % (ref 11.0–15.0)
Total Lymphocyte: 29.9 %
WBC: 4.1 Thousand/uL (ref 3.8–10.8)

## 2023-11-23 LAB — COMPLETE METABOLIC PANEL WITHOUT GFR
AG Ratio: 2 (calc) (ref 1.0–2.5)
ALT: 16 U/L (ref 9–46)
AST: 18 U/L (ref 10–35)
Albumin: 4.5 g/dL (ref 3.6–5.1)
Alkaline phosphatase (APISO): 64 U/L (ref 35–144)
BUN: 19 mg/dL (ref 7–25)
CO2: 31 mmol/L (ref 20–32)
Calcium: 9.5 mg/dL (ref 8.6–10.3)
Chloride: 97 mmol/L — ABNORMAL LOW (ref 98–110)
Creat: 0.94 mg/dL (ref 0.70–1.22)
Globulin: 2.2 g/dL (ref 1.9–3.7)
Glucose, Bld: 105 mg/dL — ABNORMAL HIGH (ref 65–99)
Potassium: 4.5 mmol/L (ref 3.5–5.3)
Sodium: 136 mmol/L (ref 135–146)
Total Bilirubin: 0.7 mg/dL (ref 0.2–1.2)
Total Protein: 6.7 g/dL (ref 6.1–8.1)

## 2023-11-23 LAB — LIPID PANEL
Cholesterol: 152 mg/dL (ref <200)
HDL: 63 mg/dL (ref >=40)
LDL Cholesterol (Calc): 73 mg/dL
Non-HDL Cholesterol (Calc): 89 mg/dL (ref <130)
Total CHOL/HDL Ratio: 2.4 (calc) (ref <5.0)
Triglycerides: 75 mg/dL (ref <150)

## 2023-11-23 LAB — PSA: PSA: 0.66 ng/mL (ref <=4.00)

## 2023-11-25 ENCOUNTER — Ambulatory Visit (INDEPENDENT_AMBULATORY_CARE_PROVIDER_SITE_OTHER): Admitting: Internal Medicine

## 2023-11-25 ENCOUNTER — Ambulatory Visit: Payer: Medicare Other | Admitting: Internal Medicine

## 2023-11-25 ENCOUNTER — Encounter: Payer: Self-pay | Admitting: Internal Medicine

## 2023-11-25 VITALS — BP 110/60 | HR 55 | Ht 66.25 in | Wt 226.0 lb

## 2023-11-25 DIAGNOSIS — Z8249 Family history of ischemic heart disease and other diseases of the circulatory system: Secondary | ICD-10-CM

## 2023-11-25 DIAGNOSIS — Z8739 Personal history of other diseases of the musculoskeletal system and connective tissue: Secondary | ICD-10-CM

## 2023-11-25 DIAGNOSIS — I10 Essential (primary) hypertension: Secondary | ICD-10-CM | POA: Diagnosis not present

## 2023-11-25 DIAGNOSIS — Z6836 Body mass index (BMI) 36.0-36.9, adult: Secondary | ICD-10-CM

## 2023-11-25 DIAGNOSIS — Z0000 Encounter for general adult medical examination without abnormal findings: Secondary | ICD-10-CM

## 2023-11-25 DIAGNOSIS — E7800 Pure hypercholesterolemia, unspecified: Secondary | ICD-10-CM

## 2023-11-25 MED ORDER — MELOXICAM 15 MG PO TABS: 15.0000 mg | ORAL_TABLET | Freq: Every day | ORAL | 3 refills | Status: AC

## 2023-11-25 NOTE — Progress Notes (Unsigned)
 Subjective:   Jimmy [name] is a 83 y.o. male who presents for Medicare Annual/Subsequent preventive examination.  Visit Complete: In person  Patient Medicare AWV questionnaire was completed by the patient on 11/25/23; I have confirmed that all information answered by patient is correct and no changes since this date.  Cardiac Risk Factors include: advanced age (>86men, >46 women);dyslipidemia;hypertension     Objective:    Today's Vitals   11/25/23 1352  BP: 110/60  Pulse: (!) 55  SpO2: 96%  Weight: 226 lb (102.5 kg)  Height: 5' 6.25 (1.683 m)  PainSc: 0-No pain   Body mass index is 36.2 kg/m.     11/25/2023    2:00 PM 11/14/2021   11:02 AM 03/17/2020   12:37 PM  Advanced Directives  Does Patient Have a Medical Advance Directive? Yes Yes No  Type of Advance Directive Living will;Healthcare Power of [employer] Power of [place];Living will   Does patient want to make changes to medical advance directive?  No - Patient declined   Copy of Healthcare Power of Attorney in Chart? Yes - validated most recent copy scanned in chart (See row information) No - copy requested   Would patient like information on creating a medical advance directive?   No - Patient declined    Current Medications (verified) Outpatient Encounter Medications as of 11/25/2023  Medication Sig   allopurinol  (ZYLOPRIM ) 300 MG tablet TAKE 1 TABLET(300 MG) BY MOUTH DAILY   ALPRAZolam  (XANAX ) 0.5 MG tablet One half to one tab 3 times daily as needed for vertigo   amLODipine  (NORVASC ) 5 MG tablet TAKE 1 TABLET(5 MG) BY MOUTH DAILY   aspirin 81 MG tablet Take 81 mg by mouth daily.   Cholecalciferol (VITAMIN D3) 10 MCG (400 UNIT) tablet Take 1,000 Units by mouth daily.   Fiber CAPS Take 1 tablet by mouth 2 (two) times daily.   FLUoxetine  (PROZAC ) 20 MG capsule TAKE 1 CAPSULE(20 MG) BY MOUTH DAILY   hydrochlorothiazide  (HYDRODIURIL ) 25 MG tablet TAKE 1 TABLET(25 MG) BY MOUTH DAILY   loratadine  (CLARITIN) 10 MG tablet Take 10 mg by mouth daily.   meloxicam  (MOBIC ) 15 MG tablet Take 1 tablet (15 mg total) by mouth daily.   sildenafil  (VIAGRA ) 100 MG tablet TAKE ONE TABLET BY MOUTH DAILY AN HOUR BEFORE INTERCOURSE   simvastatin  (ZOCOR ) 20 MG tablet TAKE 1 TABLET(20 MG) BY MOUTH AT BEDTIME   trandolapril  (MAVIK ) 2 MG tablet TAKE 1 TABLET(2 MG) BY MOUTH DAILY   No facility-administered encounter medications on file as of 11/25/2023.    Allergies (verified) Patient has no known allergies.   History: Past Medical History:  Diagnosis Date   Depression    Hyperlipidemia    Hypertension    Past Surgical History:  Procedure Laterality Date   left knee arthroscopy     TONSILLECTOMY     Family History  Problem Relation Age of Onset   Stroke Mother    Hypertension Mother    Heart disease Father 73       CABG   Alcohol abuse Father    Cancer Brother    Social History   Socioeconomic History   Marital status: Divorced    Spouse name: Not on file   Number of children: Not on file   Years of education: Not on file   Highest education level: Bachelor's degree (e.g., BA, AB, BS)  Occupational History   Not on file  Tobacco Use   Smoking status: Former  Current packs/day: 0.00    Average packs/day: 0.5 packs/day for 3.0 years (1.5 ttl pk-yrs)    Types: Cigarettes    Start date: 04/23/1965    Quit date: 04/23/1968    Years since quitting: 55.6    Passive exposure: Never   Smokeless tobacco: Never  Substance and Sexual Activity   Alcohol use: Yes    Alcohol/week: 24.0 standard drinks of alcohol    Types: 24 Cans of beer per week    Comment: daily beer   Drug use: No   Sexual activity: Not on file  Other Topics Concern   Not on file  Social History Narrative   Divorced.  Does not smoke.  Social alcohol consumption.  Has adult children.  He is Psychologist, counselling.       Social Drivers of Corporate investment banker Strain: Low Risk  (11/25/2023)    Overall Financial Resource Strain (CARDIA)    Difficulty of Paying Living Expenses: Not hard at all  Food Insecurity: No Food Insecurity (11/25/2023)   Hunger Vital Sign    Worried About Running Out of Food in the Last Year: Never true    Ran Out of Food in the Last Year: Never true  Transportation Needs: No Transportation Needs (11/25/2023)   PRAPARE - Administrator, Civil Service (Medical): No    Lack of Transportation (Non-Medical): No  Physical Activity: Insufficiently Active (11/25/2023)   Exercise Vital Sign    Days of Exercise per Week: 1 day    Minutes of Exercise per Session: 30 min  Stress: No Stress Concern Present (11/25/2023)   [employer] of Occupational Health - Occupational Stress Questionnaire    Feeling of Stress: Not at all  Social Connections: Socially Isolated (11/25/2023)   Social Connection and Isolation Panel    Frequency of Communication with Friends and Family: More than three times a week    Frequency of Social Gatherings with Friends and Family: Three times a week    Attends Religious Services: Never    Active Member of Clubs or Organizations: No    Attends Banker Meetings: Never    Marital Status: Widowed    Tobacco Counseling Counseling given: Not Answered   Clinical Intake:  Pre-visit preparation completed: Yes  Pain : No/denies pain Pain Score: 0-No pain     BMI - recorded: 36.2 Nutritional Status: BMI > 30  Obese Nutritional Risks: None  How often do you need to have someone help you when you read instructions, pamphlets, or other written materials from your doctor or pharmacy?: 1 - Never  Interpreter Needed?: No  Information entered by :: Kathlynn Porto, CMA   Activities of Daily Living    11/25/2023    2:03 PM 11/21/2023    8:50 AM  In your present state of health, do you have any difficulty performing the following activities:  Hearing? 0 0  Vision? 0 0  Difficulty concentrating or making decisions? 0  0  Walking or climbing stairs? 0 0  Dressing or bathing? 0 0  Doing errands, shopping? 0 0  Preparing Food and eating ? N N  Using the Toilet? N N  In the past six months, have you accidently leaked urine? N N  Do you have problems with loss of bowel control? N N  Managing your Medications? N N  Managing your Finances? N N  Housekeeping or managing your Housekeeping? N N    Patient Care Team: Perri Shuck  J, MD as PCP - General (Internal Medicine)  Indicate any recent Medical Services you may have received from other than [facility] providers in the past year (date may be approximate).     Assessment:   This is a routine wellness examination for Jimmy [name].  Hearing/Vision screen No results found.   Goals Addressed   None    Depression Screen    05/28/2023    9:38 AM 05/17/2022    9:35 AM 03/13/2022    2:39 PM 11/14/2021   11:03 AM 11/07/2020   11:07 AM 11/06/2019   10:36 AM 10/15/2017    2:57 PM  PHQ 2/9 Scores  PHQ - 2 Score 0 0 0 0 0 0 3  PHQ- 9 Score       7    Fall Risk    11/25/2023    2:00 PM 11/21/2023    8:50 AM 11/19/2023    9:28 AM 05/28/2023    9:38 AM 05/17/2022    9:35 AM  Fall Risk   Falls in the past year? 0 0 0 0 1  Number falls in past yr: 0 0 0 0 0  Injury with Fall? 0 0 0 0 0  Risk for fall due to : No Fall Risks   No Fall Risks Other (Comment)  Follow up Falls prevention discussed;Education provided;Falls evaluation completed   Education provided;Falls prevention discussed;Falls evaluation completed Falls prevention discussed      Data saved with a previous flowsheet row definition    MEDICARE RISK AT HOME: Medicare Risk at Home Any stairs in or around the home?: Yes If so, are there any without handrails?: No Home free of loose throw rugs in walkways, pet beds, electrical cords, etc?: Yes Adequate lighting in your home to reduce risk of falls?: Yes Life alert?: No Use of a cane, walker or w/c?: (Patient-Rptd) No Grab bars in the bathroom?:  Yes Shower chair or bench in shower?: No Elevated toilet seat or a handicapped toilet?: No  TIMED UP AND GO:  Was the test performed?  No    Cognitive Function:        11/25/2023    1:59 PM 11/14/2021   11:03 AM  6CIT Screen  What Year? 0 points 0 points  What month? 0 points 0 points  What time? 0 points 0 points  Count back from 20 0 points 0 points  Months in reverse 0 points 0 points  Repeat phrase 0 points 0 points  Total Score 0 points 0 points    Immunizations Immunization History  Administered Date(s) Administered   Influenza Whole 01/21/2010, 01/01/2011   Influenza, Seasonal, Injecte, Preservative Fre 01/03/2023   Influenza,inj,Quad PF,6+ Mos 01/13/2013, 01/12/2014, 01/13/2015, 01/12/2016, 01/17/2017, 01/01/2018, 12/09/2018, 01/13/2020, 01/25/2021, 01/03/2022   Moderna Covid-19 Vaccine Bivalent Booster 9yrs & up 05/03/2021   Moderna Sars-Covid-2 Vaccination 05/16/2019, 06/06/2019, 02/29/2020   PNEUMOCOCCAL CONJUGATE-20 05/17/2022   Pneumococcal Conjugate-13 11/18/2014   Pneumococcal Polysaccharide-23 06/21/2009   RSV,unspecified 04/23/2022   Tdap 05/08/2005, 09/06/2016   Zoster Recombinant(Shingrix) 01/30/2020, 06/06/2020   Zoster, Live 12/05/2008    TDAP status: Up to date  Flu Vaccine status: Due, Education has been provided regarding the importance of this vaccine. Advised may receive this vaccine at local pharmacy or Health Dept. Aware to provide a copy of the vaccination record if obtained from local pharmacy or Health Dept. Verbalized acceptance and understanding.  Pneumococcal vaccine status: Up to date  Covid-19 vaccine status: Information provided on how to obtain vaccines.  Qualifies for Shingles Vaccine? Yes   Zostavax completed Yes   Shingrix Completed?: Yes  Screening Tests Health Maintenance  Topic Date Due   INFLUENZA VACCINE  11/22/2023   Medicare Annual Wellness (AWV)  11/21/2024   DTaP/Tdap/Td (3 - Td or Tdap) 09/07/2026    Pneumococcal Vaccine: 50+ Years  Completed   Zoster Vaccines- Shingrix  Completed   Hepatitis B Vaccines  Aged Out   HPV VACCINES  Aged Out   Meningococcal B Vaccine  Aged Out   COVID-19 Vaccine  Discontinued    Health Maintenance  Health Maintenance Due  Topic Date Due   INFLUENZA VACCINE  11/22/2023    Colorectal cancer screening: No longer required.   Lung Cancer Screening: (Low Dose CT Chest recommended if Age 33-80 years, 20 pack-year currently smoking OR have quit w/in 15years.) does not qualify.   Additional Screening:  Hepatitis C Screening: does not qualify; Completed   Vision Screening: Recommended annual ophthalmology exams for early detection of glaucoma and other disorders of the eye. Is the patient up to date with their annual eye exam?  Yes  Who is the provider or what is the name of the office in which the patient attends annual eye exams? [facility]  If pt is not established with a provider, would they like to be referred to a provider to establish care? No .   Dental Screening: Recommended annual dental exams for proper oral hygiene  Community Resource Referral / Chronic Care Management: CRR required this visit?  No   CCM required this visit?  No     Plan:     I have personally reviewed and noted the following in the patient's chart:   Medical and social history Use of alcohol, tobacco or illicit drugs  Current medications and supplements including opioid prescriptions. Patient is not currently taking opioid prescriptions. Functional ability and status Nutritional status Physical activity Advanced directives List of other physicians Hospitalizations, surgeries, and ER visits in previous 12 months Vitals Screenings to include cognitive, depression, and falls Referrals and appointments  In addition, I have reviewed and discussed with patient certain preventive protocols, quality metrics, and best practice recommendations. A written  personalized care plan for preventive services as well as general preventive health recommendations were provided to patient.     Araceli Zelda, CMA   11/25/2023   After Visit Summary: (In Person-Printed) AVS printed and given to the patient  I, Jimmy JINNY Hailstone, MD, have reviewed all documentation for this visit. The documentation on 11/25/2023 for the exam, diagnosis, procedures, and orders are all accurate and complete.

## 2023-11-26 NOTE — Patient Instructions (Addendum)
 It was a pleasure to see you today. Please try to lose some weight before next visit. Continue same medications. Blood pressure is stable. Meloxicam  has been refilled.

## 2023-12-25 ENCOUNTER — Other Ambulatory Visit: Payer: Self-pay | Admitting: Internal Medicine

## 2024-01-28 ENCOUNTER — Ambulatory Visit (INDEPENDENT_AMBULATORY_CARE_PROVIDER_SITE_OTHER)

## 2024-01-28 VITALS — BP 120/70 | HR 70 | Ht 66.5 in | Wt 232.0 lb

## 2024-01-28 DIAGNOSIS — Z23 Encounter for immunization: Secondary | ICD-10-CM | POA: Diagnosis not present

## 2024-01-28 NOTE — Progress Notes (Signed)
 Patient presents to the office for a flu vaccine. Patient received a high dose flu vaccine IM L deltoid, patient tolerated well.

## 2024-03-24 ENCOUNTER — Other Ambulatory Visit: Payer: Self-pay | Admitting: Internal Medicine

## 2024-03-26 ENCOUNTER — Other Ambulatory Visit: Payer: Self-pay | Admitting: Internal Medicine

## 2024-05-15 NOTE — Progress Notes (Shared)
 "   Patient Care Team: Perri Ronal PARAS, MD as PCP - General (Internal Medicine)  Visit Date: 05/29/24  Subjective:    Patient ID: Jimmy [name] Daughters , Male   DOB: February 25, 1941, 84 y.o.    MRN: [id number]   83 y.o. Male presents today for 6 month follow up for Hyperlipidemia. Patient has a past medical history of Hypertension, Obesity, Anxiety/depression, Vertigo, gout, Vitamin D deficiency.  History of Hypertension treated with Amlodipine  5 mg daily and Trandolapril  2 mg daily. BP today is normal  at 110/70.     History of Hyperlipidemia treated with Simvastatin  20 mg daily.  05/29/2024 lipid panel normal.   History of Obesity; BMI 35+, today weighs 233 lb BMI 37.32. He has gained 6 pounds since last wellness exam on 11/25/2023.    History of Anxiety/Depression treated with Prozac  20 mg daily, and also is prescribed Xanax  0.25-0.5 mg up to three times daily as needed.   History of Vertigo treated with Xanax  0.25-0.5 mg up to three times daily as needed    History of Gout treated with Allopurinol  300 mg daily and Meloxicam  15 mg daily.   History of Vitamin-D Deficiency treated with Vitamin-D 1000 units daily   07/15/2023 VAS US  Carotid Mild stenosis in the left ICA 40-59%, Followed by Cardiologist Dr. Lynwood Schilling. Follow up due in one year.    Past Medical History:  Diagnosis Date   Anxiety    Depression    Hyperlipidemia    Hypertension      Family History  Problem Relation Age of Onset   Stroke Mother    Hypertension Mother    Heart disease Father 70       CABG   Alcohol abuse Father    Cancer Brother    Father, deceased due to an Acute Coronary Event w/ hx of Coronary Disease s/p CABG age 16, Diabetes, Gout, Alcohol Abuse Mother, deceased due to an Acute Arrest (perhaps stroke or MI) w/ hx of Stroke and Hypertension Siblings, 2 Brothers, 3 Sisters - 1 Brother w/ hx of Lymphoma; 1 Sister deceased in a motor vehicle accident; otherwise all in good health as far as is  known Social History   Social History Narrative   Divorced.  Does not smoke.  Social alcohol consumption.  Has adult children.  He is Psychologist, Counselling.          Review of Systems  All other systems reviewed and are negative.       Objective:   Vitals: BP 110/70   Pulse 76   Ht 5' 6.25 (1.683 m)   Wt 233 lb (105.7 kg)   SpO2 98%   BMI 37.32 kg/m    Physical Exam Constitutional:      General: He is not in acute distress.    Appearance: Normal appearance. He is not ill-appearing.  HENT:     Head: Normocephalic and atraumatic.  Cardiovascular:     Rate and Rhythm: Normal rate and regular rhythm.     Pulses: Normal pulses.     Heart sounds: Normal heart sounds. No murmur heard.    No friction rub. No gallop.  Pulmonary:     Effort: Pulmonary effort is normal. No respiratory distress.     Breath sounds: Normal breath sounds. No wheezing or rales.  Skin:    General: Skin is warm and dry.  Neurological:     Mental Status: He is alert and oriented to person, place, and time. Mental  status is at baseline.  Psychiatric:        Mood and Affect: Mood normal.        Behavior: Behavior normal.        Thought Content: Thought content normal.        Judgment: Judgment normal.       Results:   Studies obtained and personally reviewed by me:  07/15/2023 VAS US  Carotid Mild stenosis in the left ICA 40-59%, Followed by Cardiologist Dr. Lynwood Schilling. Follow up due in one year.    Labs:       Component Value Date/Time   NA 136 11/22/2023 0921   K 4.5 11/22/2023 0921   CL 97 (L) 11/22/2023 0921   CO2 31 11/22/2023 0921   GLUCOSE 105 (H) 11/22/2023 0921   BUN 19 11/22/2023 0921   CREATININE 0.94 11/22/2023 0921   CALCIUM 9.5 11/22/2023 0921   PROT 6.7 11/22/2023 0921   ALBUMIN 3.5 03/17/2020 1330   AST 18 11/22/2023 0921   ALT 16 11/22/2023 0921   ALKPHOS 48 03/17/2020 1330   BILITOT 0.7 11/22/2023 0921   GFRNONAA >60 03/17/2020 1330    GFRNONAA 73 11/05/2019 0918   GFRAA 85 11/05/2019 0918     Lab Results  Component Value Date   WBC 4.1 11/22/2023   HGB 15.4 11/22/2023   HCT 47.1 11/22/2023   MCV 99.2 11/22/2023   PLT 190 11/22/2023    Lab Results  Component Value Date   CHOL 145 05/28/2024   HDL 63 05/28/2024   LDLCALC 62 05/28/2024   TRIG 115 05/28/2024   CHOLHDL 2.3 05/28/2024    Lab Results  Component Value Date   PSA 0.66 11/22/2023   PSA 0.49 11/20/2022   PSA 0.56 11/09/2021     Assessment & Plan:   Hypertension: treated with Amlodipine  5 mg daily and Trandolapril  2 mg daily. BP today is normal  at 110/70.     Hyperlipidemia: treated with Simvastatin  20 mg daily.  05/29/2024 lipid panel normal.   Obesity; BMI 35+: today weighs 233 lb BMI 37.32. He has gained 6 pounds since last wellness exam on 11/25/2023.    Anxiety/Depression: treated with Prozac  20 mg daily, and also is prescribed Xanax  0.25-0.5 mg up to three times daily as needed.   Vertigo: treated with Xanax  0.25-0.5 mg up to three times daily as needed    Gout: treated with Allopurinol  300 mg daily and Meloxicam  15 mg daily.   Vitamin-D Deficiency: treated with Vitamin-D 1000 units daily   07/15/2023 VAS US  Carotid Mild stenosis in the left ICA 40-59%, Followed by Cardiologist Dr. Lynwood Schilling. Follow up due in one year.       I,Makayla C Reid,acting as a scribe for [name], MD.,have documented all relevant documentation on the behalf of [name], MD,as directed by  [name], MD while in the presence of [name], MD.      "

## 2024-05-28 ENCOUNTER — Other Ambulatory Visit

## 2024-05-28 ENCOUNTER — Other Ambulatory Visit: Payer: Self-pay

## 2024-05-28 DIAGNOSIS — E7800 Pure hypercholesterolemia, unspecified: Secondary | ICD-10-CM

## 2024-05-29 ENCOUNTER — Ambulatory Visit: Payer: Self-pay | Admitting: Internal Medicine

## 2024-05-29 ENCOUNTER — Encounter: Payer: Self-pay | Admitting: Internal Medicine

## 2024-05-29 VITALS — BP 110/70 | HR 76 | Ht 66.25 in | Wt 233.0 lb

## 2024-05-29 DIAGNOSIS — Z8739 Personal history of other diseases of the musculoskeletal system and connective tissue: Secondary | ICD-10-CM

## 2024-05-29 DIAGNOSIS — Z8249 Family history of ischemic heart disease and other diseases of the circulatory system: Secondary | ICD-10-CM

## 2024-05-29 DIAGNOSIS — E7800 Pure hypercholesterolemia, unspecified: Secondary | ICD-10-CM

## 2024-05-29 DIAGNOSIS — I10 Essential (primary) hypertension: Secondary | ICD-10-CM

## 2024-05-29 LAB — LIPID PANEL
Cholesterol: 145 mg/dL
HDL: 63 mg/dL
LDL Cholesterol (Calc): 62 mg/dL
Non-HDL Cholesterol (Calc): 82 mg/dL
Total CHOL/HDL Ratio: 2.3 (calc)
Triglycerides: 115 mg/dL

## 2024-07-07 ENCOUNTER — Ambulatory Visit (HOSPITAL_COMMUNITY)

## 2024-08-05 ENCOUNTER — Ambulatory Visit: Admitting: Cardiology

## 2024-12-01 ENCOUNTER — Other Ambulatory Visit

## 2024-12-08 ENCOUNTER — Ambulatory Visit: Admitting: Internal Medicine
